# Patient Record
Sex: Female | Born: 2000 | Race: Black or African American | Hispanic: No | Marital: Single | Smoking: Former smoker
Health system: Southern US, Community
[De-identification: ages and names within clinical notes are randomized; demographics above are authoritative.]

## PROBLEM LIST (undated history)

## (undated) ENCOUNTER — Inpatient Hospital Stay (HOSPITAL_COMMUNITY): Payer: Self-pay

## (undated) DIAGNOSIS — S0240EA Zygomatic fracture, right side, initial encounter for closed fracture: Secondary | ICD-10-CM

## (undated) DIAGNOSIS — R519 Headache, unspecified: Secondary | ICD-10-CM

## (undated) DIAGNOSIS — A599 Trichomoniasis, unspecified: Secondary | ICD-10-CM

## (undated) DIAGNOSIS — S36116A Major laceration of liver, initial encounter: Secondary | ICD-10-CM

## (undated) DIAGNOSIS — N390 Urinary tract infection, site not specified: Secondary | ICD-10-CM

## (undated) DIAGNOSIS — N12 Tubulo-interstitial nephritis, not specified as acute or chronic: Secondary | ICD-10-CM

## (undated) DIAGNOSIS — S0292XA Unspecified fracture of facial bones, initial encounter for closed fracture: Secondary | ICD-10-CM

## (undated) DIAGNOSIS — R51 Headache: Secondary | ICD-10-CM

## (undated) HISTORY — PX: NO PAST SURGERIES: SHX2092

---
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
## 2018-01-04 ENCOUNTER — Telehealth: Payer: Self-pay | Admitting: Licensed Clinical Social Worker

## 2018-01-04 NOTE — Telephone Encounter (Signed)
Called pt to regarding scheduled appt. Unable to leave voicemail

## 2018-01-15 ENCOUNTER — Encounter: Payer: Medicaid Other | Admitting: Student

## 2018-01-22 ENCOUNTER — Ambulatory Visit: Payer: Self-pay | Admitting: Clinical

## 2018-01-22 ENCOUNTER — Encounter: Payer: Self-pay | Admitting: General Practice

## 2018-01-22 ENCOUNTER — Other Ambulatory Visit (HOSPITAL_COMMUNITY)
Admission: RE | Admit: 2018-01-22 | Discharge: 2018-01-22 | Disposition: A | Discharge status: Home / Self Care | Payer: Medicaid Other | Source: Ambulatory Visit | Attending: Obstetrics & Gynecology | Admitting: Obstetrics & Gynecology

## 2018-01-22 ENCOUNTER — Ambulatory Visit (INDEPENDENT_AMBULATORY_CARE_PROVIDER_SITE_OTHER): Payer: Medicaid Other | Admitting: Obstetrics & Gynecology

## 2018-01-22 DIAGNOSIS — Z3402 Encounter for supervision of normal first pregnancy, second trimester: Secondary | ICD-10-CM

## 2018-01-22 DIAGNOSIS — Z3400 Encounter for supervision of normal first pregnancy, unspecified trimester: Secondary | ICD-10-CM

## 2018-01-22 DIAGNOSIS — O0990 Supervision of high risk pregnancy, unspecified, unspecified trimester: Secondary | ICD-10-CM | POA: Insufficient documentation

## 2018-01-22 DIAGNOSIS — O99320 Drug use complicating pregnancy, unspecified trimester: Secondary | ICD-10-CM | POA: Insufficient documentation

## 2018-01-22 LAB — POCT URINALYSIS DIP (DEVICE)
BILIRUBIN URINE: NEGATIVE
GLUCOSE, UA: NEGATIVE mg/dL
Hgb urine dipstick: NEGATIVE
KETONES UR: NEGATIVE mg/dL
LEUKOCYTES UA: NEGATIVE
Nitrite: NEGATIVE
Protein, ur: NEGATIVE mg/dL
Specific Gravity, Urine: 1.02 (ref 1.005–1.030)
Urobilinogen, UA: 0.2 mg/dL (ref 0.0–1.0)
pH: 7.5 (ref 5.0–8.0)

## 2018-01-22 MED ORDER — PRENATAL VITAMINS 0.8 MG PO TABS: 1.0000 | ORAL_TABLET | Freq: Every day | ORAL | 12 refills | Status: DC

## 2018-01-22 NOTE — Progress Notes (Signed)
Patient presents to office today for new OB intake. Ob & Medical History collected. New OB labs drawn. Patient will return on 12/20 for new OB appt with Dr Marice Potterove.  Chase Callerarrie H RN BSN 01/22/18

## 2018-01-22 NOTE — BH Specialist Note (Signed)
Integrated Behavioral Health Initial Visit  MRN: [id number] Name: Tiffany SmartNiyah A Leblanc  Number of Integrated Behavioral Health Clinician visits:: 1/6 Session Start time: 10:06  Session End time: 10:16 Total time: 15 minutes  Type of Service: Integrated Behavioral Health- Individual/Family Interpretor:No. Interpretor Name and Language: n/a   Warm Hand Off Completed.       SUBJECTIVE: Tiffany Leblanc is a 17 y.o. female accompanied by n/a Patient was referred by Marylynn Pearsonarrie Hillman, RN for Initial OB introduction to integrated behavioral health services . Patient reports the following symptoms/concerns: Pt states no particular concerns today.  Duration of problem: n/a; Severity of problem: n/a  OBJECTIVE: Mood: Normal and Affect: Appropriate Risk of harm to self or others: No plan to harm self or others  LIFE CONTEXT: Family and Social: - School/Work: - Self-Care: - Life Changes: Current pregnancy  GOALS ADDRESSED: 1. Increase knowledge and/or ability of: healthy habits   INTERVENTIONS: Interventions utilized: Psychoeducation and/or Health Education  Standardized Assessments completed: GAD-7 and PHQ 9  ASSESSMENT: Patient currently experiencing Supervision of low-risk first pregnancy in second trimester   Patient may benefit from Initial OB introduction to integrated behavioral health services.  PLAN: 1. Follow up with behavioral health clinician on : As requested by medical provider or patient 2. Behavioral recommendations:  -Begin taking prenatal vitamins, as recommended by medical provider 3. Referral(s): Integrated [facility]) 4. "From scale of 1-10, how likely are you to follow plan?": 10  Rae LipsJamie C Aliyyah Riese, LCSW  Depression screen [facility] 2/9 01/22/2018  Decreased Interest 2  Down, Depressed, Hopeless 0  PHQ - 2 Score 2  Altered sleeping 2  Tired, decreased energy 3  Change in appetite 2  Feeling bad or failure about yourself  0  Trouble  concentrating 0  Moving slowly or fidgety/restless 0  Suicidal thoughts 0  PHQ-9 Score 9   GAD 7 : Generalized Anxiety Score 01/22/2018  Nervous, Anxious, on Edge 0  Control/stop worrying 1  Worry too much - different things 1  Trouble relaxing 1  Restless 0  Easily annoyed or irritable 0  Afraid - awful might happen 0  Total GAD 7 Score 3

## 2018-01-23 LAB — GC/CHLAMYDIA PROBE AMP (~~LOC~~) NOT AT ARMC
CHLAMYDIA, DNA PROBE: NEGATIVE
Neisseria Gonorrhea: NEGATIVE

## 2018-01-24 LAB — URINE CULTURE, OB REFLEX: Organism ID, Bacteria: NO GROWTH

## 2018-01-24 LAB — CULTURE, OB URINE

## 2018-01-29 ENCOUNTER — Encounter: Payer: Self-pay | Admitting: *Deleted

## 2018-01-30 LAB — SMN1 COPY NUMBER ANALYSIS (SMA CARRIER SCREENING)

## 2018-01-30 LAB — CYSTIC FIBROSIS GENE TEST

## 2018-01-30 LAB — OBSTETRIC PANEL, INCLUDING HIV
Antibody Screen: NEGATIVE
BASOS ABS: 0 10*3/uL (ref 0.0–0.2)
Basos: 1 %
EOS (ABSOLUTE): 0.1 10*3/uL (ref 0.0–0.4)
EOS: 4 %
HEMATOCRIT: 32.1 % — AB (ref 34.0–46.6)
HEMOGLOBIN: 10.7 g/dL — AB (ref 11.1–15.9)
HIV SCREEN 4TH GENERATION: NONREACTIVE
Hepatitis B Surface Ag: NEGATIVE
IMMATURE GRANULOCYTES: 0 %
Immature Grans (Abs): 0 10*3/uL (ref 0.0–0.1)
Lymphocytes Absolute: 1.9 10*3/uL (ref 0.7–3.1)
Lymphs: 49 %
MCH: 28.5 pg (ref 26.6–33.0)
MCHC: 33.3 g/dL (ref 31.5–35.7)
MCV: 85 fL (ref 79–97)
MONOCYTES: 10 %
Monocytes Absolute: 0.4 10*3/uL (ref 0.1–0.9)
NEUTROS ABS: 1.4 10*3/uL (ref 1.4–7.0)
NEUTROS PCT: 36 %
Platelets: 238 10*3/uL (ref 150–450)
RBC: 3.76 x10E6/uL — AB (ref 3.77–5.28)
RDW: 12.5 % (ref 12.3–15.4)
RPR Ser Ql: NONREACTIVE
RUBELLA: 3.28 {index} (ref >0.99)
Rh Factor: POSITIVE
WBC: 3.8 10*3/uL (ref 3.4–10.8)

## 2018-01-30 LAB — HEMOGLOBINOPATHY EVALUATION
Ferritin: 39 ng/mL (ref 15–77)
HGB A2 QUANT: 2.2 % (ref 1.8–3.2)
HGB SOLUBILITY: NEGATIVE
HGB VARIANT: 0 %
Hgb A: 97.8 % (ref 96.4–98.8)
Hgb C: 0 %
Hgb F Quant: 0 % (ref 0.0–2.0)
Hgb S: 0 %

## 2018-01-30 NOTE — Progress Notes (Signed)
I have reviewed this chart and agree with the RN/CMA assessment and management.    Jamir Rone C Sanjit Mcmichael, MD, FACOG Attending Physician, Faculty Practice [facility]  

## 2018-02-08 ENCOUNTER — Ambulatory Visit (INDEPENDENT_AMBULATORY_CARE_PROVIDER_SITE_OTHER): Payer: Medicaid Other | Admitting: Obstetrics & Gynecology

## 2018-02-08 VITALS — BP 124/78 | HR 102 | Wt 130.1 lb

## 2018-02-08 DIAGNOSIS — Z8279 Family history of other congenital malformations, deformations and chromosomal abnormalities: Secondary | ICD-10-CM

## 2018-02-08 DIAGNOSIS — Z3402 Encounter for supervision of normal first pregnancy, second trimester: Secondary | ICD-10-CM

## 2018-02-08 DIAGNOSIS — Z3480 Encounter for supervision of other normal pregnancy, unspecified trimester: Secondary | ICD-10-CM

## 2018-02-08 NOTE — Progress Notes (Signed)
Ultrasound scheduled for 02/25/18 @ 1415

## 2018-02-08 NOTE — Patient Instructions (Signed)
AREA PEDIATRIC/FAMILY PRACTICE PHYSICIANS  [facility] [address], [address] [place], [place]  [address] Phone - [phone]   Fax - [phone]  [facility] [address] [place], [place] [address] Phone - [phone]   Fax - [phone]  [facility] [address] [place], [place]  [address] Phone - [phone]   Fax - [phone]  [facility] [address] [place], [place]  [address] Phone - [phone]   Fax - [phone]  [facility] [address] [place], [place]  [address] Phone - [phone]   Fax - [phone]  [facility] [address] [place], [place]  [address] Phone - [phone]   Fax - [phone]  [facility] [address] [place], [place]  [address] Phone - [phone]   Fax - [phone]  [facility] [address] [place], [place]  [address] Phone - [phone]   Fax - [phone]  [facility] [address] [place], [place]  [address] Phone - [phone]   Fax - [phone] [facility] [address] [place], [place]  [address] Phone - [phone]   Fax - [phone]  [facility] [address] [place], [place]  [address] Phone - [phone]   Fax - [phone]  [facility] [address] [place], [place]  [address] Phone - [phone]   Fax - [phone]  [facility] [address] [place], [place]  [address] Phone - [phone]   Fax - [phone]  [facility] [address] [place], [place]  [address] Phone - [phone]  [facility] [address] [place], [place]  [address] Phone - [phone]   Fax - [phone]  [facility] [address] [place], [place]  [address] Phone - [phone]   Fax - [phone]  [facility] [address] [place], [place]  [address] Phone - [phone]   Fax - [phone]  [facility] [address] [place], [place]  [address] Phone - [phone]   Fax - [phone]   [facility] [address] [place], [place]  [address] Phone - [phone]   Fax - [phone]  [facility] [address] [place], [place]  [address] Phone - [phone]   Fax - [phone]  [facility] [address] [place], [place]  [address] Phone - [phone]   Fax - [phone]  [facility] [address] [place], [place]  [address] Phone - [phone]   Fax - [phone]  [facility] [address] [place], [place]  [address] Phone - [phone]   Fax - [phone] [facility] [address] [place], [place]  [address] Phone - [phone]   Fax - [phone]  [facility] [address] [place], [place]  [address] Phone - [phone]   Fax - [phone]   [facility] [address] [place], [place]  [address] Phone - [phone]   Fax - [phone]  [facility] Charlene Flemming MD [address] [place] [address] Phone [phone]  Fax [phone]  Childbirth Education Options: [place] [facility] Classes:  Childbirth education classes can help you   get ready for a positive parenting experience. You can also meet other expectant parents and get free stuff for your baby. Each class runs for five weeks on the same night and costs $45 for the mother-to-be and her support person. Medicaid covers the cost if you are eligible. Call [phone] to register. [facility] Childbirth Education:  [phone] or [phone] or  [email or web address]  Baby & Me Class: Discuss newborn & infant parenting and family adjustment issues with other new mothers in a relaxed environment. Each week brings a new speaker or baby-centered activity. We encourage new mothers to join us every Thursday at 11:00am. Babies birth until crawling. No registration or fee. Daddy Boot Camp: This course offers Dads-to-be the tools and knowledge needed to feel confident on their journey to becoming new fathers. Experienced dads, who have been trained as coaches, teach dads-to-be how to hold, comfort, diaper, swaddle and play with their infant while being able to support the new mom as well. A class for men taught by men. $25/dad Big Brother/Big Sister: Let your children share in the joy of a new brother or sister in this special class designed just for them. Class includes discussion about how families care for babies: swaddling, holding, diapering, safety as well as how they can be helpful in their new role. This class is designed for children ages 2 to 6, but any age is welcome. Please register each child individually. $5/child  Mom Talk: This mom-led group offers support and connection to mothers as they journey through the adjustments and struggles of that sometimes overwhelming first year after the birth of a child. Tuesdays at 10:00am and Thursdays at 6:00pm. Babies welcome. No registration or fee. Breastfeeding Support Group: This group is a mother-to-mother support circle where moms have the opportunity to share their breastfeeding experiences. A Lactation Consultant is present for questions and concerns. Meets each Tuesday at 11:00am. No fee or registration. Breastfeeding Your Baby: Learn what to expect in the first days of breastfeeding your newborn.  This class will help you feel more confident with the skills needed to begin your breastfeeding experience. Many new mothers are concerned about breastfeeding after leaving the hospital. This class  will also address the most common fears and challenges about breastfeeding during the first few weeks, months and beyond. (call for fee) Comfort Techniques and Tour: This 2 hour interactive class will provide you the opportunity to learn & practice hands-on techniques that can help relieve some of the discomfort of labor and encourage your baby to rotate toward the best position for birth. You and your partner will be able to try a variety of labor positions with birth balls and rebozos as well as practice breathing, relaxation, and visualization techniques. A tour of the [facility] is included with this class. $20 per registrant and support person Childbirth Class- Weekend Option: This class is a Weekend version of our Birth & Baby series. It is designed for parents who have a difficult time fitting several weeks of classes into their schedule. It covers the care of your newborn and the basics of labor and childbirth. It also includes a Maternity Care Center Tour of [facility] and lunch. The class is held two consecutive days: beginning on Friday evening from 6:30 - 8:30 p.m. and the next day, Saturday from 9 a.m. - 4 p.m. (call for fee) Waterbirth Class: Interested in a waterbirth?  This informational class will help you discover whether waterbirth is the right fit for you.   Education about waterbirth itself, supplies you would need and how to assemble your support team is what you can expect from this class. Some obstetrical practices require this class in order to pursue a waterbirth. (Not all obstetrical practices offer waterbirth-check with your healthcare provider.) Register only the expectant mom, but you are encouraged to bring your partner to class! Required if planning waterbirth, no fee. Infant/Child CPR: Parents, grandparents, babysitters, and friends learn Cardio-Pulmonary Resuscitation skills for infants and children. You will also learn how to treat both conscious  and unconscious choking in infants and children. This Family & Friends program does not offer certification. Register each participant individually to ensure that enough mannequins are available. (Call for fee) Grandparent Love: Expecting a grandbaby? This class is for you! Learn about the latest infant care and safety recommendations and ways to support your own child as he or she transitions into the parenting role. Taught by Registered Nurses who are childbirth instructors, but most importantly...they are grandmothers too! $10/person. Childbirth Class- Natural Childbirth: This series of 5 weekly classes is for expectant parents who want to learn and practice natural methods of coping with the process of labor and childbirth. Relaxation, breathing, massage, visualization, role of the partner, and helpful positioning are highlighted. Participants learn how to be confident in their body's ability to give birth. This class will empower and help parents make informed decisions about their own care. Includes discussion that will help new parents transition into the immediate postpartum period. [facility] is included. We suggest taking this class between 25-32 weeks, but it's only a recommendation. $75 per registrant and one support person or $30 Medicaid. Childbirth Class- 3 week Series: This option of 3 weekly classes helps you and your labor partner prepare for childbirth. Newborn care, labor & birth, cesarean birth, pain management, and comfort techniques are discussed and a [place] [facility] is included. The class meets at the same time, on the same day of the week for 3 consecutive weeks beginning with the starting date you choose. $60 for registrant and one support person.  Marvelous Multiples: Expecting twins, triplets, or more? This class covers the differences in labor, birth, parenting, and breastfeeding issues that face multiples' parents.  NICU tour is included. Led by a Certified Childbirth Educator who is the mother of twins. No fee. Caring for Baby: This class is for expectant and adoptive parents who want to learn and practice the most up-to-date newborn care for their babies. Focus is on birth through the first six weeks of life. Topics include feeding, bathing, diapering, crying, umbilical cord care, circumcision care and safe sleep. Parents learn to recognize symptoms of illness and when to call the pediatrician. Register only the mom-to-be and your partner or support person can plan to come with you! $10 per registrant and support person Childbirth Class- online option: This online class offers you the freedom to complete a Birth and Baby series in the comfort of your own home. The flexibility of this option allows you to review sections at your own pace, at times convenient to you and your support people. It includes additional video information, animations, quizzes, and extended activities. Get organized with helpful eClass tools, checklists, and trackers. Once you register online for the class, you will receive an email within a few days to accept the invitation and begin the class when the time is right for you. The content will be available to you for 60 days. $  60 for 60 days of online access for you and your support people.  Local Doulas: Natural Baby Doulas [email or web address] Tel: [phone] [email or web address] [facility] [phone] [email or web address] The Labor Ladies  (also do waterbirth tub rental) [phone] [email or web address] [facility] [phone] [email or web address] Sacred Rhythms  [phone] [email or web address] [place] [facility] [employer] (PADA) [email or web address] http://www.padanc.org/index.htm [place] [facility]   [email or web address] Considering Waterbirth? Guide for patients at [facility]  Why consider waterbirth?  . Gentle birth for babies . Less pain medicine used in labor . May allow for passive descent/less pushing . May reduce perineal tears  . More mobility and instinctive maternal position changes . Increased maternal relaxation . Reduced blood pressure in labor  Is waterbirth safe? What are the risks of infection, drowning or other complications?  . Infection: o Very low risk (3.7 % for tub vs 4.8% for bed) o 7 in 8000 waterbirths with documented infection o Poorly cleaned equipment most common cause o Slightly lower group B strep transmission rate  . Drowning o Maternal:  - Very low risk   - Related to seizures or fainting o Newborn:  - Very low risk. No evidence of increased risk of respiratory problems in multiple large studies - Physiological protection from breathing under water - Avoid underwater birth if there are any fetal complications - Once baby's head is out of the water, keep it out.  . Birth complication o Some reports of cord trauma, but risk decreased by bringing baby to surface gradually o No evidence of increased risk of shoulder dystocia. Mothers can usually change positions faster in water than in a bed, possibly aiding the maneuvers to free the shoulder.   You must attend a Waterbirth class at [facility]  3rd Wednesday of every month from 7-9pm  Free  Register by calling [phone] or online at [email or web address]  Bring us the certificate from the class to your prenatal appointment  Meet with a midwife at 36 weeks to see if you can still plan a waterbirth and to sign the consent.   Purchase or rent the following supplies:   Water Birth Pool (Birth Pool in a Box or [place] for instance)  (Tubs start ~$125)  Single-use disposable tub liner designed for your brand of tub  New garden hose labeled  "lead-free", "suitable for drinking water",  Electric drain pump to remove water (We recommend 792 gallon per hour or greater pump.)   Separate garden hose to remove the dirty water  Fish net  Bathing suit top (optional)  Long-handled mirror (optional)  Places to purchase or rent supplies  Yourwaterbirth.com for tub purchases and supplies  Waterbirthsolutions.com for tub purchases and supplies  The Labor Ladies (www.[email or web address]) $275 for tub rental/set-up & take down/kit   [place] [facility] [employer] (http://www.padanc.org/MeetUs.htm) Information regarding doulas (labor support) who provide pool rentals  Our practice has a Birth Pool in a Box tub at the hospital that you may borrow on a first-come-first-served basis. It is your responsibility to to set up, clean and break down the tub. We cannot guarantee the availability of this tub in advance. You are responsible for bringing all accessories listed above. If you do not have all necessary supplies you cannot have a waterbirth.    Things that would prevent you from having a waterbirth:  Premature, <37wks  Previous cesarean birth  Presence of thick meconium-stained fluid  Multiple gestation (Twins, triplets, etc.)  Uncontrolled diabetes or gestational diabetes requiring medication  Hypertension requiring medication   or diagnosis of pre-eclampsia  Heavy vaginal bleeding  Non-reassuring fetal heart rate  Active infection (MRSA, etc.). Group B Strep is NOT a contraindication for  waterbirth.  If your labor has to be induced and induction method requires continuous  monitoring of the baby's heart rate  Other risks/issues identified by your obstetrical provider  Please remember that birth is unpredictable. Under certain unforeseeable circumstances your provider may advise against giving birth in the tub. These decisions will be made on a case-by-case basis and with the safety of you and your baby as our highest  priority.      
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
## 2018-02-12 LAB — AFP, SERUM, OPEN SPINA BIFIDA
AFP MoM: 0.58
AFP VALUE AFPOSL: 26.8 ng/mL
Gest. Age on Collection Date: 17 weeks
MATERNAL AGE AT EDD: 19.5 a
OSBR Risk 1 IN: 10000
Test Results:: NEGATIVE
Weight: 130 [lb_av]

## 2018-02-18 ENCOUNTER — Encounter (HOSPITAL_COMMUNITY): Payer: Self-pay

## 2018-02-20 NOTE — L&D Delivery Note (Signed)
Operative Delivery Note Pt pushed vtx to +3 and variable decels became prolonged. Dr. Macon Large offered and pt accepted a VAD. Verbal consent: obtained from patient.  Risks and benefits discussed in detail.  Risks include, but are not limited to the risks of anesthesia, bleeding, infection, damage to maternal tissues, fetal cephalhematoma.  There is also the risk of inability to effect vaginal delivery of the head, or shoulder dystocia that cannot be resolved by established maneuvers, leading to the need for emergency cesarean section. At 4:22 AM a viable female was delivered via Vaginal, Vacuum Investment banker, operational).  Presentation: vertex; Position: Occiput,, Anterior; Station: +3.  UPon delivery, the baby was floppy, so the cord was clamped and cut.and baby given to awaiting NICU team.     APGAR: 6, 8; weight  Pending. .    The placenta separated spontaneously and delivered via CCT and maternal pushing effort.  It was inspected and appears to be intact with a 3 VC.   40 units of pitocin diluted in 1000cc LR was infused rapidly IV.   , .   Cord:  with the following complications: .  Cord pH: pending  Anesthesia:  epidural Episiotomy:  none Lacerations: Periurethral Suture Repair: 4-0 vicryl Est. Blood Loss (mL): 200  Mom to postpartum.  Baby to Couplet care / Skin to Skin.  Scarlette Calico Cresenzo-Dishmon 07/14/2018, 4:42 AM

## 2018-02-25 ENCOUNTER — Ambulatory Visit (HOSPITAL_COMMUNITY)
Admission: RE | Admit: 2018-02-25 | Discharge: 2018-02-25 | Disposition: A | Discharge status: Home / Self Care | Payer: Medicaid Other | Source: Ambulatory Visit | Attending: Obstetrics & Gynecology | Admitting: Obstetrics & Gynecology

## 2018-02-25 DIAGNOSIS — Z363 Encounter for antenatal screening for malformations: Secondary | ICD-10-CM

## 2018-02-25 DIAGNOSIS — Z3480 Encounter for supervision of other normal pregnancy, unspecified trimester: Secondary | ICD-10-CM | POA: Diagnosis not present

## 2018-02-25 DIAGNOSIS — Z3A19 19 weeks gestation of pregnancy: Secondary | ICD-10-CM | POA: Diagnosis not present

## 2018-02-26 ENCOUNTER — Encounter: Payer: Self-pay | Admitting: Obstetrics & Gynecology

## 2018-02-26 ENCOUNTER — Other Ambulatory Visit (HOSPITAL_COMMUNITY): Payer: Self-pay | Admitting: *Deleted

## 2018-02-26 DIAGNOSIS — O43199 Other malformation of placenta, unspecified trimester: Secondary | ICD-10-CM

## 2018-02-26 DIAGNOSIS — Z049 Encounter for examination and observation for unspecified reason: Secondary | ICD-10-CM | POA: Insufficient documentation

## 2018-02-27 ENCOUNTER — Telehealth: Payer: Self-pay | Admitting: Licensed Clinical Social Worker

## 2018-02-27 NOTE — Telephone Encounter (Signed)
Pt confirm appt. 

## 2018-02-28 ENCOUNTER — Encounter: Payer: Medicaid Other | Admitting: Student

## 2018-02-28 NOTE — Patient Instructions (Signed)
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
## 2018-03-01 ENCOUNTER — Encounter: Payer: Self-pay | Admitting: Advanced Practice Midwife

## 2018-03-01 ENCOUNTER — Ambulatory Visit (INDEPENDENT_AMBULATORY_CARE_PROVIDER_SITE_OTHER): Payer: Medicaid Other | Admitting: Advanced Practice Midwife

## 2018-03-01 ENCOUNTER — Encounter: Payer: Self-pay | Admitting: Family Medicine

## 2018-03-01 VITALS — BP 103/63 | HR 60 | Wt 132.8 lb

## 2018-03-01 DIAGNOSIS — Z594 Lack of adequate food and safe drinking water: Secondary | ICD-10-CM

## 2018-03-01 DIAGNOSIS — O43192 Other malformation of placenta, second trimester: Secondary | ICD-10-CM

## 2018-03-01 DIAGNOSIS — Z3402 Encounter for supervision of normal first pregnancy, second trimester: Secondary | ICD-10-CM

## 2018-03-01 DIAGNOSIS — Z5941 Food insecurity: Secondary | ICD-10-CM

## 2018-03-01 DIAGNOSIS — Z3A20 20 weeks gestation of pregnancy: Secondary | ICD-10-CM

## 2018-03-01 DIAGNOSIS — F329 Major depressive disorder, single episode, unspecified: Secondary | ICD-10-CM | POA: Insufficient documentation

## 2018-03-01 DIAGNOSIS — F32A Depression, unspecified: Secondary | ICD-10-CM | POA: Insufficient documentation

## 2018-03-01 DIAGNOSIS — O99342 Other mental disorders complicating pregnancy, second trimester: Secondary | ICD-10-CM

## 2018-03-01 DIAGNOSIS — Z8279 Family history of other congenital malformations, deformations and chromosomal abnormalities: Secondary | ICD-10-CM

## 2018-03-01 DIAGNOSIS — O43199 Other malformation of placenta, unspecified trimester: Secondary | ICD-10-CM

## 2018-03-01 HISTORY — DX: Food insecurity: Z59.41

## 2018-03-01 MED ORDER — PRENATAL VITAMINS 0.8 MG PO TABS: 1.0000 | ORAL_TABLET | Freq: Every day | ORAL | 12 refills | Status: DC

## 2018-03-01 NOTE — Progress Notes (Signed)
Fetal echo scheduled with Dr Elizebeth Brooking for 04/16/18 @ 0930. Pt made aware and verbalized understanding.

## 2018-03-07 ENCOUNTER — Encounter (HOSPITAL_COMMUNITY): Payer: Self-pay | Admitting: *Deleted

## 2018-03-07 ENCOUNTER — Inpatient Hospital Stay (HOSPITAL_COMMUNITY)
Admission: AD | Admit: 2018-03-07 | Discharge: 2018-03-07 | Disposition: A | Discharge status: Home / Self Care | Payer: Medicaid Other | Attending: Obstetrics & Gynecology | Admitting: Obstetrics & Gynecology

## 2018-03-07 DIAGNOSIS — O26892 Other specified pregnancy related conditions, second trimester: Secondary | ICD-10-CM | POA: Insufficient documentation

## 2018-03-07 DIAGNOSIS — T148XXA Other injury of unspecified body region, initial encounter: Secondary | ICD-10-CM | POA: Diagnosis not present

## 2018-03-07 DIAGNOSIS — S39011A Strain of muscle, fascia and tendon of abdomen, initial encounter: Secondary | ICD-10-CM | POA: Insufficient documentation

## 2018-03-07 DIAGNOSIS — Z3492 Encounter for supervision of normal pregnancy, unspecified, second trimester: Secondary | ICD-10-CM

## 2018-03-07 DIAGNOSIS — R109 Unspecified abdominal pain: Secondary | ICD-10-CM | POA: Diagnosis present

## 2018-03-07 DIAGNOSIS — O43192 Other malformation of placenta, second trimester: Secondary | ICD-10-CM

## 2018-03-07 DIAGNOSIS — O43199 Other malformation of placenta, unspecified trimester: Secondary | ICD-10-CM

## 2018-03-07 DIAGNOSIS — Z3A20 20 weeks gestation of pregnancy: Secondary | ICD-10-CM | POA: Diagnosis not present

## 2018-03-07 LAB — URINALYSIS, ROUTINE W REFLEX MICROSCOPIC
BILIRUBIN URINE: NEGATIVE
Glucose, UA: NEGATIVE mg/dL
Hgb urine dipstick: NEGATIVE
KETONES UR: NEGATIVE mg/dL
Leukocytes, UA: NEGATIVE
NITRITE: NEGATIVE
PH: 7 (ref 5.0–8.0)
Protein, ur: NEGATIVE mg/dL
Specific Gravity, Urine: 1.018 (ref 1.005–1.030)

## 2018-03-07 MED ORDER — ACETAMINOPHEN 325 MG PO TABS: 650.0000 mg | ORAL_TABLET | ORAL | 2 refills | Status: DC | PRN

## 2018-03-07 MED ORDER — ACETAMINOPHEN 500 MG PO TABS
1000.0000 mg | ORAL_TABLET | Freq: Once | ORAL | Status: AC
  Administered 2018-03-07: 1000 mg via ORAL
  Filled 2018-03-07: qty 2

## 2018-03-07 NOTE — MAU Note (Signed)
Pt reports sharp pain in her lower abd sicne 7 am, denies bleeding.

## 2018-03-07 NOTE — MAU Provider Note (Signed)
History     CSN: [id number]  Arrival date and time: 03/07/18 [id number]   First Provider Initiated Contact with Patient 03/07/18 0957      Chief Complaint  Patient presents with  . Abdominal Pain   HPI Tiffany Leblanc is a 18 y.o. G1P0000 at 6359w6d who presents to MAU with chief complaint of abdominal pain. This is a new problem, onset this morning at about 0700. Pain is 7/10, periumbilical, does not radiate. She denies aggravating or alleviating factors. She has not taken medication or tried other treatments for this pain. She denies bleeding, lower abdominal pain, flank pain, urinary symptoms, fever or recent illness.  Patient states she was wrestling with her uncle last night at  About 11pm. She endorses hitting her abdomen during their horseplay.   OB History    Gravida  1   Para  0   Term  0   Preterm  0   AB  0   Living  0     SAB  0   TAB  0   Ectopic  0   Multiple  0   Live Births  0        Obstetric Comments  Patient admits to being sexually active when asked in private        Past Medical History:  Diagnosis Date  . Headache   . Pyelonephritis   . Urinary tract infection     Past Surgical History:  Procedure Laterality Date  . NO PAST SURGERIES      Family History  Problem Relation Age of Onset  . Heart murmur Brother   . Hypertension Maternal Grandmother   . Arthritis Maternal Grandfather   . Cancer Paternal Grandmother     Social History   Tobacco Use  . Smoking status: Never Smoker  . Smokeless tobacco: Never Used  Substance Use Topics  . Alcohol use: Not Currently  . Drug use: Yes    Types: Marijuana    Allergies:  Allergies  Allergen Reactions  . Coconut Oil     States hives with coconut soap    Medications Prior to Admission  Medication Sig Dispense Refill Last Dose  . Prenatal Multivit-Min-Fe-FA (PRENATAL VITAMINS) 0.8 MG tablet Take 1 tablet by mouth daily. 30 tablet 12     Review of Systems  Constitutional:  Negative for chills, fatigue and fever.  Gastrointestinal: Positive for abdominal pain. Negative for constipation, diarrhea, nausea and vomiting.  Genitourinary: Negative for difficulty urinating, dysuria, flank pain, pelvic pain, vaginal bleeding, vaginal discharge and vaginal pain.  Musculoskeletal: Negative for back pain.  All other systems reviewed and are negative.  Physical Exam   Blood pressure 114/65, pulse 78, temperature 97.7 F (36.5 C), temperature source Oral, resp. rate 16, last menstrual period 10/12/2017, SpO2 100 %.  Physical Exam  Nursing note and vitals reviewed. Constitutional: She is oriented to person, place, and time. She appears well-developed and well-nourished.  Cardiovascular: Normal rate.  Respiratory: Effort normal.  GI: Soft. Bowel sounds are normal. She exhibits no distension. There is no abdominal tenderness. There is no rebound, no guarding and no CVA tenderness.  Gravid  Neurological: She is alert and oriented to person, place, and time.  Skin: Skin is warm and dry.  Psychiatric: She has a normal mood and affect. Her behavior is normal. Judgment normal.    MAU Course/MDM   --Pertinent negatives: vaginal bleeding, pain with urination, fever, flank pain, history of UTIs or kidney stones --Pain relieved with  Tylenol given in MAU --No concerning findings on physical exam or UA  Patient Vitals for the past 24 hrs:  BP Temp Temp src Pulse Resp SpO2  03/07/18 1135 117/62 - - 87 - -  03/07/18 0934 114/65 97.7 F (36.5 C) Oral 78 16 100 %    Results for orders placed or performed during the hospital encounter of 03/07/18 (from the past 24 hour(s))  Urinalysis, Routine w reflex microscopic     Status: Abnormal   Collection Time: 03/07/18 11:17 AM  Result Value Ref Range   Color, Urine YELLOW YELLOW   APPearance HAZY (A) CLEAR   Specific Gravity, Urine 1.018 1.005 - 1.030   pH 7.0 5.0 - 8.0   Glucose, UA NEGATIVE NEGATIVE mg/dL   Hgb urine dipstick  NEGATIVE NEGATIVE   Bilirubin Urine NEGATIVE NEGATIVE   Ketones, ur NEGATIVE NEGATIVE mg/dL   Protein, ur NEGATIVE NEGATIVE mg/dL   Nitrite NEGATIVE NEGATIVE   Leukocytes, UA NEGATIVE NEGATIVE    Meds ordered this encounter  Medications  . acetaminophen (TYLENOL) tablet 1,000 mg  . acetaminophen (TYLENOL) 325 MG tablet    Sig: Take 2 tablets (650 mg total) by mouth every 4 (four) hours as needed.    Dispense:  100 tablet    Refill:  2    Order Specific Question:   Supervising Provider    Answer:   Reva BoresPRATT, TANYA S [[id number]]   Assessment and Plan  --18 y.o. G1P0000 at 3172w6d  --FHT 143 by Doppler --Muscle strain related to recent physical exertion. Rx Tylenol --Discharge home in stable condition.  [facility] 03/29/2018  Tiffany CantorSamantha C Bing Leblanc, CNM 03/07/2018, 11:48 AM

## 2018-03-07 NOTE — Discharge Instructions (Signed)

## 2018-03-29 ENCOUNTER — Ambulatory Visit (INDEPENDENT_AMBULATORY_CARE_PROVIDER_SITE_OTHER): Payer: Medicaid Other | Admitting: Advanced Practice Midwife

## 2018-03-29 VITALS — BP 107/57 | HR 68 | Wt 138.9 lb

## 2018-03-29 DIAGNOSIS — Z3402 Encounter for supervision of normal first pregnancy, second trimester: Secondary | ICD-10-CM

## 2018-03-29 NOTE — Patient Instructions (Signed)
Glucose Tolerance Test During Pregnancy Why am I having this test? The glucose tolerance test (GTT) is done to check how your body processes sugar (glucose). This is one of several tests used to diagnose diabetes that develops during pregnancy (gestational diabetes mellitus). Gestational diabetes is a temporary form of diabetes that some women develop during pregnancy. It usually occurs during the second trimester of pregnancy and goes away after delivery. Testing (screening) for gestational diabetes usually occurs between 24 and 28 weeks of pregnancy. You may have the GTT test after having a 1-hour glucose screening test if the results from that test indicate that you may have gestational diabetes. You may also have this test if:  You have a history of gestational diabetes.  You have a history of giving birth to very large babies or have experienced repeated fetal loss (stillbirth).  You have signs and symptoms of diabetes, such as: ? Changes in your vision. ? Tingling or numbness in your hands or feet. ? Changes in hunger, thirst, and urination that are not otherwise explained by your pregnancy. What is being tested? This test measures the amount of glucose in your blood at different times during a period of 3 hours. This indicates how well your body is able to process glucose. What kind of sample is taken?  Blood samples are required for this test. They are usually collected by inserting a needle into a blood vessel. How do I prepare for this test?  For 3 days before your test, eat normally. Have plenty of carbohydrate-rich foods.  Follow instructions from your health care provider about: ? Eating or drinking restrictions on the day of the test. You may be asked to not eat or drink anything other than water (fast) starting 8-10 hours before the test. ? Changing or stopping your regular medicines. Some medicines may interfere with this test. Tell a health care provider about:  All  medicines you are taking, including vitamins, herbs, eye drops, creams, and over-the-counter medicines.  Any blood disorders you have.  Any surgeries you have had.  Any medical conditions you have. What happens during the test? First, your blood glucose will be measured. This is referred to as your fasting blood glucose, since you fasted before the test. Then, you will drink a glucose solution that contains a certain amount of glucose. Your blood glucose will be measured again 1, 2, and 3 hours after drinking the solution. This test takes about 3 hours to complete. You will need to stay at the testing location during this time. During the testing period:  Do not eat or drink anything other than the glucose solution.  Do not exercise.  Do not use any products that contain nicotine or tobacco, such as cigarettes and e-cigarettes. If you need help stopping, ask your health care provider. The testing procedure may vary among health care providers and hospitals. How are the results reported? Your results will be reported as milligrams of glucose per deciliter of blood (mg/dL) or millimoles per liter (mmol/L). Your health care provider will compare your results to normal ranges that were established after testing a large group of people (reference ranges). Reference ranges may vary among labs and hospitals. For this test, common reference ranges are:  Fasting: less than 95-105 mg/dL (5.3-5.8 mmol/L).  1 hour after drinking glucose: less than 180-190 mg/dL (10.0-10.5 mmol/L).  2 hours after drinking glucose: less than 155-165 mg/dL (8.6-9.2 mmol/L).  3 hours after drinking glucose: 140-145 mg/dL (7.8-8.1 mmol/L). What do the   results mean? Results within reference ranges are considered normal, meaning that your glucose levels are well-controlled. If two or more of your blood glucose levels are high, you may be diagnosed with gestational diabetes. If only one level is high, your health care  provider may suggest repeat testing or other tests to confirm a diagnosis. Talk with your health care provider about what your results mean. Questions to ask your health care provider Ask your health care provider, or the department that is doing the test:  When will my results be ready?  How will I get my results?  What are my treatment options?  What other tests do I need?  What are my next steps? Summary  The glucose tolerance test (GTT) is one of several tests used to diagnose diabetes that develops during pregnancy (gestational diabetes mellitus). Gestational diabetes is a temporary form of diabetes that some women develop during pregnancy.  You may have the GTT test after having a 1-hour glucose screening test if the results from that test indicate that you may have gestational diabetes. You may also have this test if you have any symptoms or risk factors for gestational diabetes.  Talk with your health care provider about what your results mean. This information is not intended to replace advice given to you by your health care provider. Make sure you discuss any questions you have with your health care provider. Document Released: 08/08/2011 Document Revised: 09/18/2016 Document Reviewed: 09/18/2016 Elsevier Interactive Patient Education  2019 Elsevier Inc.  

## 2018-03-29 NOTE — Progress Notes (Signed)
Subjective: Tiffany Leblanc is a G1P0000 at [redacted]w[redacted]d who presents to the [facility] today for ob visit.  She does not have any current mental health concerns. She is not currently sexually active. She is currently using no method for birth control. Patient states mother and father of baby as her support system.   LMP 10/12/2017   Birth Control History:  No prior history  MDM Patient counseled on all options for birth control today including LARC. Patient desires no method  initiated for birth control.   Assessment:  18 y.o. female considering no method for birth control  Plan: Patient reserved 3 tickets to tour the new hospital and wic appt is scheduled for 04/03/2018 @ 215pm  Gwyndolyn Saxon, [place] 03/29/2018 11:26 AM
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
## 2018-04-01 ENCOUNTER — Encounter (HOSPITAL_COMMUNITY): Payer: Self-pay

## 2018-04-01 ENCOUNTER — Ambulatory Visit (HOSPITAL_COMMUNITY): Payer: Medicaid Other | Attending: Advanced Practice Midwife

## 2018-04-08 ENCOUNTER — Ambulatory Visit (HOSPITAL_COMMUNITY)
Admission: RE | Admit: 2018-04-08 | Discharge: 2018-04-08 | Disposition: A | Discharge status: Home / Self Care | Payer: Medicaid Other | Source: Ambulatory Visit | Attending: Maternal & Fetal Medicine | Admitting: Maternal & Fetal Medicine

## 2018-04-08 DIAGNOSIS — Z3A25 25 weeks gestation of pregnancy: Secondary | ICD-10-CM | POA: Diagnosis not present

## 2018-04-08 DIAGNOSIS — O43199 Other malformation of placenta, unspecified trimester: Secondary | ICD-10-CM | POA: Diagnosis not present

## 2018-04-08 DIAGNOSIS — Z362 Encounter for other antenatal screening follow-up: Secondary | ICD-10-CM | POA: Diagnosis not present

## 2018-04-25 ENCOUNTER — Other Ambulatory Visit: Payer: Self-pay | Admitting: *Deleted

## 2018-04-25 DIAGNOSIS — Z3480 Encounter for supervision of other normal pregnancy, unspecified trimester: Secondary | ICD-10-CM

## 2018-04-26 ENCOUNTER — Other Ambulatory Visit: Payer: Self-pay

## 2018-04-26 ENCOUNTER — Encounter: Payer: Self-pay | Admitting: Advanced Practice Midwife

## 2018-05-01 ENCOUNTER — Other Ambulatory Visit: Payer: Self-pay

## 2018-05-02 ENCOUNTER — Encounter: Payer: Self-pay | Admitting: Obstetrics and Gynecology

## 2018-05-02 ENCOUNTER — Other Ambulatory Visit: Payer: Self-pay

## 2018-05-03 ENCOUNTER — Other Ambulatory Visit: Payer: Self-pay

## 2018-05-03 ENCOUNTER — Ambulatory Visit (INDEPENDENT_AMBULATORY_CARE_PROVIDER_SITE_OTHER): Payer: Medicaid Other | Admitting: Advanced Practice Midwife

## 2018-05-03 ENCOUNTER — Other Ambulatory Visit: Payer: Medicaid Other

## 2018-05-03 VITALS — BP 120/78 | HR 79 | Wt 148.1 lb

## 2018-05-03 DIAGNOSIS — Z3A29 29 weeks gestation of pregnancy: Secondary | ICD-10-CM

## 2018-05-03 DIAGNOSIS — Z23 Encounter for immunization: Secondary | ICD-10-CM | POA: Diagnosis not present

## 2018-05-03 DIAGNOSIS — Z3A30 30 weeks gestation of pregnancy: Secondary | ICD-10-CM

## 2018-05-03 DIAGNOSIS — Z8279 Family history of other congenital malformations, deformations and chromosomal abnormalities: Secondary | ICD-10-CM

## 2018-05-03 DIAGNOSIS — O43193 Other malformation of placenta, third trimester: Secondary | ICD-10-CM

## 2018-05-03 DIAGNOSIS — Z3480 Encounter for supervision of other normal pregnancy, unspecified trimester: Secondary | ICD-10-CM

## 2018-05-03 DIAGNOSIS — Z3403 Encounter for supervision of normal first pregnancy, third trimester: Secondary | ICD-10-CM

## 2018-05-03 DIAGNOSIS — O43199 Other malformation of placenta, unspecified trimester: Secondary | ICD-10-CM

## 2018-05-03 NOTE — Patient Instructions (Addendum)
AREA PEDIATRIC/FAMILY PRACTICE PHYSICIANS  [place] ([address]) . [facility] o Chambliss, MD; Eniola, MD; Hale, MD; Hensel, MD; McDiarmid, MD; McIntyer, MD; Neal, MD; Walden, MD o [address] [place], [place] [address] o [phone] o Mon-Fri 8:30-12:30, 1:30-5:00 o Providers come to see babies at [facility] o Accepting Medicaid . [facility] o Limited providers who accept newborns: Koirala, MD; Morrow, MD; Wolters, MD o [address], [place], [place] [address] o [phone] o Mon-Fri 8:00-5:30 o Babies seen by providers at [facility] o Does NOT accept Medicaid o Please call early in hospitalization for appointment (limited availability)  . [facility] o [place], MD o [address] [place], [place] [address] o [phone] o Mon, Tue, Thur, Fri 8:30-5:00, Wed 10:00-7:00 (closed 1-2pm) o Babies seen by [facility] providers o Accepting Medicaid . Rubin - Pediatrician o Rubin, MD o [address], [place], [place] [address] o [phone] o Mon-Fri 8:30-5:00, Sat 8:30-12:00 o Provider comes to see babies at [facility] o Accepting Medicaid o Must have been referred from current patients or contacted office prior to delivery . [facility] ([facility] o Brown, MD; Chandler, MD; Ettefagh, MD; Grant, MD; Lester, MD; McCormick, MD; McQueen, MD; Prose, MD; Simha, MD; Stanley, MD; Stryffeler, NP; Tebben, NP o [address], [place], [place] [address] o [phone] o Mon, Tue, Thur, Fri 8:30-5:30, Wed 9:30-5:30, Sat 8:30-12:30 o Babies seen by [facility] providers o Accepting Medicaid o Only accepting infants of first-time parents or siblings of current patients o Hospital discharge coordinator will make follow-up appointment . Jack Amos o [address],  [place], [place]  [address] o [phone]   Fax - [phone] . [facility] o [address], [place], [place]  [address] o Phone - [phone]   Fax - [phone] . Shilpa Gosrani o [address], [place], [place]  [address] o [phone]  [place] ([address]) . [facility] o Bates, MD; [place], MD; Cooper, Cox, MD; MD; Davis, MD; Dovico, MD; Ettefaugh, MD; Little, MD; Lowe, MD; Keiffer, MD; Melvin, MD; Sumner, MD; Williams, MD o [address], [place], [place] [address] o [phone] o Mon-Fri 8:30-5:00 (extended evenings Mon-Thur as needed), Sat-Sun 10:00-1:00 o Providers come to see babies at [facility] o Accepting Medicaid for families of first-time babies and families with all children in the household age 3 and under. Must register with office prior to making appointment (M-F only). . [facility] o Henson, NP; Knapp, MD; Lalonde, MD; Tysinger, PA o [address] [place], [place] [address] o [phone] o Mon-Fri 8:00-5:00 o Babies seen by providers at [facility] o Does NOT accept Medicaid/Commercial Insurance Only . [facility] ([facility])  o Artis, MD; Barnes, MD; Bratton, MD; Coccaro, MD; Lockett Gardner, MD; Kramer, MD; Marshall, MD; Netherton, MD; Poleto, MD; Skinner, MD o [address] [place], [place] [address] o [phone] o Mon-Fri 8:30-5:30, Sat (Oct.-Mar.) 9:00-1:00 o Babies seen by providers at [facility] o Accepting Medicaid  [place] ([address]) . [facility] o Reid, MD; Warner, MD o [address], [place],  [address] o [phone] o Mon-Fri 8:30-5:00, Sat 8:30-12:00 o Providers come to see babies at [facility] o Does NOT accept Medicaid . [facility] Ricci Barker, PA; Mannie Stabile, MD; [place], [place]; Nancy Fetter, MD; Moreen Fowler, [place],  [place], [place] [address] o [phone] o Mon-Fri 8:00-5:00 o Babies seen by providers at [facility] o Does NOT accept Medicaid o Only accepting babies of parents who are patients o Please call early in hospitalization for appointment (limited availability) . [facility] Pediatricians Blanca Friend, MD; Sharlene Motts, MD; Rod Can, MD; Warner Mccreedy, NP; Sabra Heck, MD; Ermalinda Memos, MD; Sharlett Iles, NP; Aurther Loft, MD; Jerrye Beavers, MD; Marcello Moores, MD; Berline Lopes, MD; Charolette Forward, MD o [place]. [place], [place], [place] [address] o [phone] o Mon-Fri 8:00-5:00, Sat 9:00-12:00 o Providers come to see babies at [facility] o Does NOT accept [facility] [phone]) . [facility] providers accepting new patients: Dayna Ramus, NP; [place], [place], [place], [place] [address] o [phone] o Mon-Fri 8:00-5:00 o Babies seen by providers at [facility] o Does NOT accept Medicaid o Only accepting babies of parents who are patients o Please call early in hospitalization for appointment (limited availability) . [facility] Oswaldo Conroy, MD; Sheran Lawless, MD o [address] [place], [place] [address] o [phone] (press 1 to schedule appointment) o Mon-Fri 8:00-5:00 o Providers come to see babies at [facility] o Does NOT accept Medicaid . [facility] Jodi Mourning, MD o [address] [place], [place] [address] o [phone] o Mon-Fri 8:30-5:00 (lunch 12:30-1:00), extended hours by appointment only Wed 5:00-6:30 o Babies seen by [facility] providers o Accepting Medicaid . [place] [facility] Evalyn Casco, MD; [place], MD; Ethlyn Gallery, MD o [place], [place], [place] [address] o [phone] o Mon-Fri 8:00-5:00 o Babies seen by [facility] providers o Does NOT accept Medicaid . Therapist, music at [place], MD; Yong Channel, MD; [place], [place] [address] [place], [place] [address] o  [phone] o Mon-Fri 8:00-5:00 o Babies seen by [facility] providers o Does NOT accept Medicaid . [place], [place]; [place], [place]; [place], NP; Albertina Parr, MD; Frederic Jericho, MD; Ronney Lion, MD; Carlos Levering, NP; Jerelene Redden, NP; Tomasita Crumble, NP; Ronelle Nigh, NP; Corinna Lines, MD; [place], MD o [address] [place], [place] [address] o [phone] o Mon-Fri 8:30-5:00, Sat 10:00-1:00 o Providers come to see babies at [facility] o Does NOT accept Medicaid o Free prenatal information session Tuesdays at 4:45pm . [facility] Porfirio Oar, MD; [place], [place]; [place], [place]; [place], [place]., [place] [address] o [phone] o Mon-Fri 7:30-5:30 o Babies seen by [facility] providers . [facility] Children[facility] Doctor o [address], [place], [place], [place]  [address] o [phone]   Fax - [phone]  [place] [phone] & [phone]) . [place], MD o [address] [place], [place] [address] o [phone] o Mon-Thur 8:00-6:00 o Providers come to see babies at [place] Medicaid . [place], NP; Melford Aase, MD; [place], [place]; [place], [address] [place], [place] [address] o [phone] o Mon-Thur 7:30-7:30, Fri 7:30-4:30 o Babies seen by [facility] providers o Accepting Medicaid . [facility] Nyra Jabs, MD; Cristino Martes, NP; Gertie Baron, MD o [place] [address], [place], [place] [address] o [phone] o Mon-Fri 8:30-5:00, Sat 8:30-12:00 o Providers come to see babies at [place] Medicaid o Must have "Meet & Greet" appointment at office prior to delivery . [place] ([place]) o Hubbard,  MD; Juleen China, MD; Clydene Laming, [place] [address], [place], [place] [address] o [phone] o Mon-Wed 8:00-6:00, Thur-Fri 8:00-5:00, Sat 9:00-12:00 o Providers come to see  babies at [facility] o Does NOT accept Medicaid o Only accepting siblings of current patients . [facility] [place] [facility], [place], [place], [place]  [address] o [phone]   Fax [phone] . [place] [facility] [place] [address], [place], [place]  [address] o [phone]   Fax - Artois Craig [phone] & [phone]) . Therapist, music at [place], DO; [place], [address] [place], [place] [address] o [phone] o Mon-Fri 7:00-5:00 o Babies seen by [facility] providers o Does NOT accept Medicaid . Cochrane, MD; [place], [place]; [place], [place], [place], [place] [address] o [phone] o Mon-Fri 8:00-5:00 o Babies seen by [facility] providers o Accepting Medicaid . Reed, MD; [place], [place]; [place], NP; [place], [place], [place], [place] [address] o [phone] o Mon-Fri 8:00-5:00 o Babies seen by providers at [facility] [place] [phone]) . [facility] [place], [place] o [address] [place], [place] [address] o [phone] o Mon-Fri 8:00-5:00 o Babies seen by [facility] providers o Does NOT accept Medicaid o Limited availability, please call early in hospitalization to schedule follow-up . [facility] Leilani Merl, PA; Maisie Fus, MD; [place], MD; [place], [place]; Jeannine Kitten, MD; [place], [place]y [facility] [address], [place], [place] [address] o [phone] o Mon-Fri 8:30-5:00, Sat 9:00-12:00 o Babies seen by providers at [facility] o Accepting Medicaid o Please register online then schedule online or call office o [email or web address] . [place] [facility] [place]) Kristian Covey, NP; Dwyane Dee, MD; Leonidas Romberg, PA o [address] [place], [place], [place] [address] o [phone] o Mon-Fri 8:00-5:00 o Babies seen by providers at [facility] o Accepting Medicaid . [place] ([facility]) Dairl Ponder, MD; Rayvon Char, NP; Melina Modena, MD o [address] [place], [place], [place] [address] o [phone] o Mon-Fri 8:00-5:30, Sat&Sun by appointment (phones open at 8:30) o Babies seen by [facility] providers o Accepting Medicaid o Must be a first-time baby or sibling of current patient . [place], [address], [place], [place]  [address] o [phone]   Fax - [phone]  [place] [phone] & [phone]) . [place], [place]; [place], [place]; Benjamine Mola, MD; [place], [place]; Harrell Lark, MD o [address], [place], [place] [address] o [phone] o Mon-Thur 8:00-7:00, Fri 8:00-5:00, Sat 8:00-12:00, Sun 9:00-12:00 o Babies seen by [facility] providers o Accepting Medicaid . [facility], MD; Ruthann Cancer, MD; [facility], MD o [address], [place],  [address] o [phone] o Mon-Thur 8:00-5:00 o Babies seen by providers at [facility] o Accepting Medicaid . [facility] [place], MD; Coe-Goins, MD; Amedeo Plenty, MD; Bobby Rumpf, MD; List, MD; Lavonia Drafts, MD; Ruthann Cancer, MD; Selinda Eon, MD; Audie Box, MD; Jim Like, MD; Christie Nottingham, MD; [place], MD; [place]ena Nunnery, MD o [address] [place], [place]  [address] o [phone] o Mon-Fri 8:00-5:30, Sat (Oct.-Mar.) 9:00-1:00 o Babies seen by providers at [facility] o Accepting Medicaid o Must fill out new patient packet, available online at [email or web address] . [facility] ([facility] o Friddle, NP; Harris, NP; Kelly, NP; Logan, MD;  [place]vin, PA; Poth, MD; Ramadoss, MD; Stanton, NP o [address], [place], [place] [address] o [phone] o Mon-Thur 8:00-5:30, Fri 8:00-5:00 o Babies seen by providers at [facility] o Accepting Medicaid  [place] ([address]) . [facility] o Dixon, PA; B[place], MD; Pickard, MD; Tapia, PA o [address], [place], [place] [address] o [phone] o Mon-Fri 8:00-5:00 o Babies seen by providers at [facility] o Accepting Medicaid   [place] ([address]) . [facility] [place] o Masneri, DO; Meyers, MD; Nelson, PA o [address], [place], [place] [address] o [phone] o Mon-Fri 8:00-5:00 o Babies seen by providers at [facility] o Does NOT accept Medicaid o Limited appointment availability, please call early in hospitalization  . [facility] [place] o Kunedd, DO; McGowen, MD o [address], [place], [place] [address] o [phone] o Mon-Fri 8:00-5:00 o Babies seen by [facility] providers o Does NOT accept Medicaid . [facility] [place] o Cameron, MD; MacDonald, MD; Michaels, PA; Nayak, MD o [address] [place] [address], [place], [place] [address] o [phone] o Mon-Fri 8:00-5:00 o After hours clinic ([address] [place], [place] [address]) [phone] Mon-Fri 5:00-8:00, Sat 12:00-6:00, Sun 10:00-4:00 o Babies seen by [facility] providers o Accepting Medicaid . [facility] [place] o [address], [place], [place]  [address] o [phone]   Fax - [phone]  [place] ([address]) . [facility] [place] o Andy, MD o [address], [place], [place] [address] o [phone] o Mon-Fri 8:00-5:00 o Babies seen by [facility] providers o Does NOT accept Medicaid . [facility] [place] ([facility] [place]) o Eksir, MD o [address], [place], [place]  [address] o [phone] o Mon-Thur 8:00-7:00, Fri 8:00-5:00, Sat 8:00-12:00 o Babies seen by providers at [facility] o Accepting Medicaid - but does not have vaccinations in office (must be received elsewhere) o Limited availability, please call early in hospitalization  [place] ([address]) . [facility]  o Charlene Flemming, MD o [address], [place] [address] o [phone]  Fax [phone]       Third Trimester of Pregnancy The third trimester is from week 28 through week 40 (months 7 through 9). The third trimester is a time when the unborn baby (fetus) is growing rapidly. At the end of the ninth month, the fetus is about 20 inches in length and weighs 6-10 pounds. Body changes during your third trimester Your body will continue to go through many changes during pregnancy. The changes vary from woman to woman. During the third trimester:  Your weight will continue to increase. You can expect to gain 25-35 pounds (11-16 kg) by the end of the pregnancy.  You may begin to get stretch marks on your hips, abdomen, and breasts.  You may urinate more often because the fetus is moving lower into your pelvis and pressing on your bladder.  You may develop or continue to have heartburn. This is caused by increased hormones that slow down muscles in the digestive   tract.  You may develop or continue to have constipation because increased hormones slow digestion and cause the muscles that push waste through your intestines to relax.  You may develop hemorrhoids. These are swollen veins (varicose veins) in the rectum that can itch or be painful.  You may develop swollen, bulging veins (varicose veins) in your legs.  You may have increased body aches in the pelvis, back, or thighs. This is due to weight gain and increased hormones that are relaxing your joints.  You may have changes in your hair. These can include thickening of your hair, rapid growth, and changes  in texture. Some women also have hair loss during or after pregnancy, or hair that feels dry or thin. Your hair will most likely return to normal after your baby is born.  Your breasts will continue to grow and they will continue to become tender. A yellow fluid (colostrum) may leak from your breasts. This is the first milk you are producing for your baby.  Your belly button may stick out.  You may notice more swelling in your hands, face, or ankles.  You may have increased tingling or numbness in your hands, arms, and legs. The skin on your belly may also feel numb.  You may feel short of breath because of your expanding uterus.  You may have more problems sleeping. This can be caused by the size of your belly, increased need to urinate, and an increase in your body's metabolism.  You may notice the fetus "dropping," or moving lower in your abdomen (lightening).  You may have increased vaginal discharge.  You may notice your joints feel loose and you may have pain around your pelvic bone. What to expect at prenatal visits You will have prenatal exams every 2 weeks until week 36. Then you will have weekly prenatal exams. During a routine prenatal visit:  You will be weighed to make sure you and the baby are growing normally.  Your blood pressure will be taken.  Your abdomen will be measured to track your baby's growth.  The fetal heartbeat will be listened to.  Any test results from the previous visit will be discussed.  You may have a cervical check near your due date to see if your cervix has softened or thinned (effaced).  You will be tested for Group B streptococcus. This happens between 35 and 37 weeks. Your health care provider may ask you:  What your birth plan is.  How you are feeling.  If you are feeling the baby move.  If you have had any abnormal symptoms, such as leaking fluid, bleeding, severe headaches, or abdominal cramping.  If you are using any tobacco  products, including cigarettes, chewing tobacco, and electronic cigarettes.  If you have any questions. Other tests or screenings that may be performed during your third trimester include:  Blood tests that check for low iron levels (anemia).  Fetal testing to check the health, activity level, and growth of the fetus. Testing is done if you have certain medical conditions or if there are problems during the pregnancy.  Nonstress test (NST). This test checks the health of your baby to make sure there are no signs of problems, such as the baby not getting enough oxygen. During this test, a belt is placed around your belly. The baby is made to move, and its heart rate is monitored during movement. What is false labor? False labor is a condition in which you feel small, irregular tightenings of   the muscles in the womb (contractions) that usually go away with rest, changing position, or drinking water. These are called Braxton Hicks contractions. Contractions may last for hours, days, or even weeks before true labor sets in. If contractions come at regular intervals, become more frequent, increase in intensity, or become painful, you should see your health care provider. What are the signs of labor?  Abdominal cramps.  Regular contractions that start at 10 minutes apart and become stronger and more frequent with time.  Contractions that start on the top of the uterus and spread down to the lower abdomen and back.  Increased pelvic pressure and dull back pain.  A watery or bloody mucus discharge that comes from the vagina.  Leaking of amniotic fluid. This is also known as your "water breaking." It could be a slow trickle or a gush. Let your health care provider know if it has a color or strange odor. If you have any of these signs, call your health care provider right away, even if it is before your due date. Follow these instructions at home: Medicines  Follow your health care provider's  instructions regarding medicine use. Specific medicines may be either safe or unsafe to take during pregnancy.  Take a prenatal vitamin that contains at least 600 micrograms (mcg) of folic acid.  If you develop constipation, try taking a stool softener if your health care provider approves. Eating and drinking   Eat a balanced diet that includes fresh fruits and vegetables, whole grains, good sources of protein such as meat, eggs, or tofu, and low-fat dairy. Your health care provider will help you determine the amount of weight gain that is right for you.  Avoid raw meat and uncooked cheese. These carry germs that can cause birth defects in the baby.  If you have low calcium intake from food, talk to your health care provider about whether you should take a daily calcium supplement.  Eat four or five small meals rather than three large meals a day.  Limit foods that are high in fat and processed sugars, such as fried and sweet foods.  To prevent constipation: ? Drink enough fluid to keep your urine clear or pale yellow. ? Eat foods that are high in fiber, such as fresh fruits and vegetables, whole grains, and beans. Activity  Exercise only as directed by your health care provider. Most women can continue their usual exercise routine during pregnancy. Try to exercise for 30 minutes at least 5 days a week. Stop exercising if you experience uterine contractions.  Avoid heavy lifting.  Do not exercise in extreme heat or humidity, or at high altitudes.  Wear low-heel, comfortable shoes.  Practice good posture.  You may continue to have sex unless your health care provider tells you otherwise. Relieving pain and discomfort  Take frequent breaks and rest with your legs elevated if you have leg cramps or low back pain.  Take warm sitz baths to soothe any pain or discomfort caused by hemorrhoids. Use hemorrhoid cream if your health care provider approves.  Wear a good support bra to  prevent discomfort from breast tenderness.  If you develop varicose veins: ? Wear support pantyhose or compression stockings as told by your healthcare provider. ? Elevate your feet for 15 minutes, 3-4 times a day. Prenatal care  Write down your questions. Take them to your prenatal visits.  Keep all your prenatal visits as told by your health care provider. This is important. Safety  Wear your   seat belt at all times when driving.  Make a list of emergency phone numbers, including numbers for family, friends, the hospital, and police and fire departments. General instructions  Avoid cat litter boxes and soil used by cats. These carry germs that can cause birth defects in the baby. If you have a cat, ask someone to clean the litter box for you.  Do not travel far distances unless it is absolutely necessary and only with the approval of your health care provider.  Do not use hot tubs, steam rooms, or saunas.  Do not drink alcohol.  Do not use any products that contain nicotine or tobacco, such as cigarettes and e-cigarettes. If you need help quitting, ask your health care provider.  Do not use any medicinal herbs or unprescribed drugs. These chemicals affect the formation and growth of the baby.  Do not douche or use tampons or scented sanitary pads.  Do not cross your legs for long periods of time.  To prepare for the arrival of your baby: ? Take prenatal classes to understand, practice, and ask questions about labor and delivery. ? Make a trial run to the hospital. ? Visit the hospital and tour the maternity area. ? Arrange for maternity or paternity leave through employers. ? Arrange for family and friends to take care of pets while you are in the hospital. ? Purchase a rear-facing car seat and make sure you know how to install it in your car. ? Pack your hospital bag. ? Prepare the baby's nursery. Make sure to remove all pillows and stuffed animals from the baby's crib to  prevent suffocation.  Visit your dentist if you have not gone during your pregnancy. Use a soft toothbrush to brush your teeth and be gentle when you floss. Contact a health care provider if:  You are unsure if you are in labor or if your water has broken.  You become dizzy.  You have mild pelvic cramps, pelvic pressure, or nagging pain in your abdominal area.  You have lower back pain.  You have persistent nausea, vomiting, or diarrhea.  You have an unusual or bad smelling vaginal discharge.  You have pain when you urinate. Get help right away if:  Your water breaks before 37 weeks.  You have regular contractions less than 5 minutes apart before 37 weeks.  You have a fever.  You are leaking fluid from your vagina.  You have spotting or bleeding from your vagina.  You have severe abdominal pain or cramping.  You have rapid weight loss or weight gain.  You have shortness of breath with chest pain.  You notice sudden or extreme swelling of your face, hands, ankles, feet, or legs.  Your baby makes fewer than 10 movements in 2 hours.  You have severe headaches that do not go away when you take medicine.  You have vision changes. Summary  The third trimester is from week 28 through week 40, months 7 through 9. The third trimester is a time when the unborn baby (fetus) is growing rapidly.  During the third trimester, your discomfort may increase as you and your baby continue to gain weight. You may have abdominal, leg, and back pain, sleeping problems, and an increased need to urinate.  During the third trimester your breasts will keep growing and they will continue to become tender. A yellow fluid (colostrum) may leak from your breasts. This is the first milk you are producing for your baby.  False labor is a   condition in which you feel small, irregular tightenings of the muscles in the womb (contractions) that eventually go away. These are called Braxton Hicks  contractions. Contractions may last for hours, days, or even weeks before true labor sets in.  Signs of labor can include: abdominal cramps; regular contractions that start at 10 minutes apart and become stronger and more frequent with time; watery or bloody mucus discharge that comes from the vagina; increased pelvic pressure and dull back pain; and leaking of amniotic fluid. This information is not intended to replace advice given to you by your health care provider. Make sure you discuss any questions you have with your health care provider. Document Released: 01/31/2001 Document Revised: 03/14/2016 Document Reviewed: 03/14/2016 Elsevier Interactive Patient Education  2019 Elsevier Inc.  
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
## 2018-05-04 LAB — CBC
Hematocrit: 33.2 % — ABNORMAL LOW (ref 34.0–46.6)
Hemoglobin: 10.9 g/dL — ABNORMAL LOW (ref 11.1–15.9)
MCH: 29 pg (ref 26.6–33.0)
MCHC: 32.8 g/dL (ref 31.5–35.7)
MCV: 88 fL (ref 79–97)
Platelets: 290 10*3/uL (ref 150–450)
RBC: 3.76 x10E6/uL — ABNORMAL LOW (ref 3.77–5.28)
RDW: 12 % (ref 11.7–15.4)
WBC: 5.8 10*3/uL (ref 3.4–10.8)

## 2018-05-04 LAB — GLUCOSE TOLERANCE, 2 HOURS W/ 1HR
GLUCOSE, 1 HOUR: 81 mg/dL (ref 65–179)
Glucose, 2 hour: 53 mg/dL — ABNORMAL LOW (ref 65–152)
Glucose, Fasting: 72 mg/dL (ref 65–91)

## 2018-05-04 LAB — RPR: RPR Ser Ql: NONREACTIVE

## 2018-05-04 LAB — HIV ANTIBODY (ROUTINE TESTING W REFLEX): HIV Screen 4th Generation wRfx: NONREACTIVE

## 2018-05-07 ENCOUNTER — Other Ambulatory Visit: Payer: Self-pay

## 2018-05-07 ENCOUNTER — Ambulatory Visit (HOSPITAL_COMMUNITY)
Admission: RE | Admit: 2018-05-07 | Discharge: 2018-05-07 | Disposition: A | Discharge status: Home / Self Care | Payer: Medicaid Other | Source: Ambulatory Visit | Attending: Advanced Practice Midwife | Admitting: Advanced Practice Midwife

## 2018-05-07 DIAGNOSIS — Z362 Encounter for other antenatal screening follow-up: Secondary | ICD-10-CM

## 2018-05-07 DIAGNOSIS — Z3A30 30 weeks gestation of pregnancy: Secondary | ICD-10-CM | POA: Diagnosis present

## 2018-05-07 DIAGNOSIS — Z3403 Encounter for supervision of normal first pregnancy, third trimester: Secondary | ICD-10-CM | POA: Insufficient documentation

## 2018-05-07 DIAGNOSIS — O43199 Other malformation of placenta, unspecified trimester: Secondary | ICD-10-CM | POA: Insufficient documentation

## 2018-05-07 DIAGNOSIS — Z3A29 29 weeks gestation of pregnancy: Secondary | ICD-10-CM

## 2018-05-07 DIAGNOSIS — Z8279 Family history of other congenital malformations, deformations and chromosomal abnormalities: Secondary | ICD-10-CM | POA: Diagnosis present

## 2018-05-08 ENCOUNTER — Other Ambulatory Visit (HOSPITAL_COMMUNITY): Payer: Self-pay | Admitting: *Deleted

## 2018-05-08 DIAGNOSIS — O403XX0 Polyhydramnios, third trimester, not applicable or unspecified: Secondary | ICD-10-CM

## 2018-05-10 ENCOUNTER — Encounter: Payer: Self-pay | Admitting: Advanced Practice Midwife

## 2018-05-10 DIAGNOSIS — O409XX0 Polyhydramnios, unspecified trimester, not applicable or unspecified: Secondary | ICD-10-CM | POA: Insufficient documentation

## 2018-05-10 DIAGNOSIS — Z8759 Personal history of other complications of pregnancy, childbirth and the puerperium: Secondary | ICD-10-CM | POA: Insufficient documentation

## 2018-05-14 ENCOUNTER — Encounter: Payer: Self-pay | Admitting: *Deleted

## 2018-05-21 ENCOUNTER — Ambulatory Visit (HOSPITAL_COMMUNITY)
Admission: RE | Admit: 2018-05-21 | Discharge: 2018-05-21 | Disposition: A | Discharge status: Home / Self Care | Payer: Medicaid Other | Source: Ambulatory Visit | Attending: Obstetrics and Gynecology | Admitting: Obstetrics and Gynecology

## 2018-05-21 ENCOUNTER — Ambulatory Visit (HOSPITAL_COMMUNITY): Payer: Medicaid Other | Admitting: *Deleted

## 2018-05-21 ENCOUNTER — Other Ambulatory Visit: Payer: Self-pay

## 2018-05-21 ENCOUNTER — Other Ambulatory Visit (HOSPITAL_COMMUNITY): Payer: Self-pay | Admitting: *Deleted

## 2018-05-21 DIAGNOSIS — Z049 Encounter for examination and observation for unspecified reason: Secondary | ICD-10-CM

## 2018-05-21 DIAGNOSIS — O409XX0 Polyhydramnios, unspecified trimester, not applicable or unspecified: Secondary | ICD-10-CM | POA: Diagnosis present

## 2018-05-21 DIAGNOSIS — Z3403 Encounter for supervision of normal first pregnancy, third trimester: Secondary | ICD-10-CM | POA: Diagnosis present

## 2018-05-21 DIAGNOSIS — Z362 Encounter for other antenatal screening follow-up: Secondary | ICD-10-CM | POA: Diagnosis not present

## 2018-05-21 DIAGNOSIS — O403XX0 Polyhydramnios, third trimester, not applicable or unspecified: Secondary | ICD-10-CM | POA: Diagnosis present

## 2018-05-21 DIAGNOSIS — Z3A31 31 weeks gestation of pregnancy: Secondary | ICD-10-CM

## 2018-05-21 NOTE — Progress Notes (Signed)
States she has experienced small amounts of clear fluid from vagina X 2, several days apart.

## 2018-05-24 ENCOUNTER — Other Ambulatory Visit (HOSPITAL_COMMUNITY): Payer: Self-pay | Admitting: *Deleted

## 2018-05-24 DIAGNOSIS — O409XX0 Polyhydramnios, unspecified trimester, not applicable or unspecified: Secondary | ICD-10-CM

## 2018-05-24 NOTE — Progress Notes (Unsigned)
.  mfm

## 2018-05-27 ENCOUNTER — Encounter (HOSPITAL_COMMUNITY): Payer: Self-pay

## 2018-05-27 ENCOUNTER — Ambulatory Visit (HOSPITAL_COMMUNITY): Payer: Medicaid Other

## 2018-06-04 ENCOUNTER — Ambulatory Visit (HOSPITAL_COMMUNITY): Payer: Medicaid Other

## 2018-06-17 ENCOUNTER — Telehealth: Payer: Self-pay | Admitting: Advanced Practice Midwife

## 2018-06-17 NOTE — Telephone Encounter (Signed)
The patient has not been seen sine 3/13. She is 35 weeks, I scheduled the patient in a new gyn area to see her as soon as possible. Called the patient with the upcoming appointment information.

## 2018-06-17 NOTE — Telephone Encounter (Signed)
Left a detailed voicemail message concerning our new location and upcoming appointment information.

## 2018-06-19 ENCOUNTER — Encounter (HOSPITAL_COMMUNITY): Payer: Self-pay

## 2018-06-19 ENCOUNTER — Other Ambulatory Visit: Payer: Self-pay

## 2018-06-19 ENCOUNTER — Ambulatory Visit (HOSPITAL_COMMUNITY): Payer: Medicaid Other | Admitting: *Deleted

## 2018-06-19 ENCOUNTER — Ambulatory Visit (HOSPITAL_COMMUNITY)
Admission: RE | Admit: 2018-06-19 | Discharge: 2018-06-19 | Disposition: A | Discharge status: Home / Self Care | Payer: Medicaid Other | Source: Ambulatory Visit | Attending: Obstetrics and Gynecology | Admitting: Obstetrics and Gynecology

## 2018-06-19 VITALS — BP 122/75 | HR 92 | Temp 98.4°F

## 2018-06-19 DIAGNOSIS — Z3A35 35 weeks gestation of pregnancy: Secondary | ICD-10-CM

## 2018-06-19 DIAGNOSIS — O403XX0 Polyhydramnios, third trimester, not applicable or unspecified: Secondary | ICD-10-CM | POA: Insufficient documentation

## 2018-06-19 DIAGNOSIS — Z362 Encounter for other antenatal screening follow-up: Secondary | ICD-10-CM

## 2018-06-19 DIAGNOSIS — Z3403 Encounter for supervision of normal first pregnancy, third trimester: Secondary | ICD-10-CM | POA: Insufficient documentation

## 2018-06-19 DIAGNOSIS — Z049 Encounter for examination and observation for unspecified reason: Secondary | ICD-10-CM

## 2018-06-19 DIAGNOSIS — O409XX0 Polyhydramnios, unspecified trimester, not applicable or unspecified: Secondary | ICD-10-CM | POA: Diagnosis present

## 2018-06-20 ENCOUNTER — Other Ambulatory Visit (HOSPITAL_COMMUNITY): Payer: Self-pay | Admitting: *Deleted

## 2018-06-20 DIAGNOSIS — O403XX0 Polyhydramnios, third trimester, not applicable or unspecified: Secondary | ICD-10-CM

## 2018-06-24 ENCOUNTER — Telehealth: Payer: Self-pay | Admitting: Family Medicine

## 2018-06-24 ENCOUNTER — Encounter: Payer: Self-pay | Admitting: Family Medicine

## 2018-06-24 ENCOUNTER — Other Ambulatory Visit: Payer: Medicaid Other

## 2018-06-24 NOTE — Telephone Encounter (Signed)
Called the patient with an upcoming appointment information for Wednesday and also informed the patient of the new location. Patient verbalized understanding.

## 2018-06-24 NOTE — Telephone Encounter (Signed)
Called the patient with an appointment reminder.

## 2018-06-24 NOTE — Telephone Encounter (Signed)
Called the patient with an reminder call regarding the appointment. Stated she did not receive the mychart reminder. After reviewing her mychart messages it is found the patient reviewed the message when it was initially sent 06/17/2018.

## 2018-06-26 ENCOUNTER — Ambulatory Visit: Payer: Self-pay

## 2018-06-26 ENCOUNTER — Ambulatory Visit (INDEPENDENT_AMBULATORY_CARE_PROVIDER_SITE_OTHER): Payer: Medicaid Other | Admitting: *Deleted

## 2018-06-26 ENCOUNTER — Other Ambulatory Visit: Payer: Self-pay

## 2018-06-26 VITALS — BP 140/84 | HR 89 | Wt 174.8 lb

## 2018-06-26 DIAGNOSIS — Z3A36 36 weeks gestation of pregnancy: Secondary | ICD-10-CM | POA: Diagnosis not present

## 2018-06-26 DIAGNOSIS — O163 Unspecified maternal hypertension, third trimester: Secondary | ICD-10-CM

## 2018-06-26 DIAGNOSIS — O403XX0 Polyhydramnios, third trimester, not applicable or unspecified: Secondary | ICD-10-CM | POA: Diagnosis not present

## 2018-06-26 NOTE — Progress Notes (Signed)
Pt informed that the ultrasound is considered a limited OB ultrasound and is not intended to be a complete ultrasound exam.  Patient also informed that the ultrasound is not being completed with the intent of assessing for fetal or placental anomalies or any pelvic abnormalities.  Explained that the purpose of today's ultrasound is to assess for presentation, BPP and amniotic fluid volume.  Patient acknowledges the purpose of the exam and the limitations of the study.    Pt has had 26 lb weight gain since last visit (3/13). BP is mildly elevated today. She denies H/A or visual disturbances. BP values discussed w/Dr. Marice Potter - pre-e labs done.

## 2018-06-27 ENCOUNTER — Ambulatory Visit (HOSPITAL_COMMUNITY): Payer: Medicaid Other

## 2018-06-27 ENCOUNTER — Encounter (HOSPITAL_COMMUNITY): Payer: Self-pay

## 2018-06-27 LAB — CBC
Hematocrit: 30.1 % — ABNORMAL LOW (ref 34.0–46.6)
Hemoglobin: 9.8 g/dL — ABNORMAL LOW (ref 11.1–15.9)
MCH: 25.8 pg — ABNORMAL LOW (ref 26.6–33.0)
MCHC: 32.6 g/dL (ref 31.5–35.7)
MCV: 79 fL (ref 79–97)
Platelets: 294 10*3/uL (ref 150–450)
RBC: 3.8 x10E6/uL (ref 3.77–5.28)
RDW: 12.5 % (ref 11.7–15.4)
WBC: 8.1 10*3/uL (ref 3.4–10.8)

## 2018-06-27 LAB — PROTEIN / CREATININE RATIO, URINE
Creatinine, Urine: 104.1 mg/dL
Protein, Ur: 42 mg/dL
Protein/Creat Ratio: 403 mg/g creat — ABNORMAL HIGH (ref 0–200)

## 2018-06-27 LAB — COMP. METABOLIC PANEL (12)
AST: 21 IU/L (ref 0–40)
Albumin/Globulin Ratio: 1.3 (ref 1.2–2.2)
Albumin: 3.3 g/dL — ABNORMAL LOW (ref 3.9–5.0)
Alkaline Phosphatase: 114 IU/L (ref 39–117)
BUN/Creatinine Ratio: 17 (ref 9–23)
BUN: 12 mg/dL (ref 6–20)
Bilirubin Total: <0.2 mg/dL (ref 0.0–1.2)
Calcium: 9.6 mg/dL (ref 8.7–10.2)
Chloride: 104 mmol/L (ref 96–106)
Creatinine, Ser: 0.69 mg/dL (ref 0.57–1.00)
GFR calc Af Amer: 146 mL/min/{1.73_m2} (ref >59)
GFR calc non Af Amer: 127 mL/min/{1.73_m2} (ref >59)
Globulin, Total: 2.5 g/dL (ref 1.5–4.5)
Glucose: 66 mg/dL (ref 65–99)
Potassium: 4.5 mmol/L (ref 3.5–5.2)
Sodium: 134 mmol/L (ref 134–144)
Total Protein: 5.8 g/dL — ABNORMAL LOW (ref 6.0–8.5)

## 2018-06-28 ENCOUNTER — Telehealth: Payer: Self-pay | Admitting: *Deleted

## 2018-06-28 ENCOUNTER — Ambulatory Visit: Payer: Medicaid Other

## 2018-06-28 NOTE — Telephone Encounter (Signed)
Opened in error

## 2018-06-28 NOTE — Telephone Encounter (Signed)
Called pt regarding her appointment for BP check.  Pt did not pick up.  Advised pt that the clinic closes at 12pm and if she is unable to make it by this time then she will need to go to [facility] to be evaluated.

## 2018-07-01 ENCOUNTER — Telehealth: Payer: Self-pay | Admitting: Obstetrics & Gynecology

## 2018-07-01 NOTE — Telephone Encounter (Signed)
I called and left a voicemail that she should be seen in the office today.

## 2018-07-01 NOTE — Telephone Encounter (Signed)
Called the patient to confirm the appointment, the patient verbalized understating. °

## 2018-07-04 ENCOUNTER — Ambulatory Visit (INDEPENDENT_AMBULATORY_CARE_PROVIDER_SITE_OTHER): Payer: Medicaid Other | Admitting: Obstetrics and Gynecology

## 2018-07-04 ENCOUNTER — Encounter: Payer: Self-pay | Admitting: Obstetrics and Gynecology

## 2018-07-04 ENCOUNTER — Other Ambulatory Visit (HOSPITAL_COMMUNITY)
Admission: RE | Admit: 2018-07-04 | Discharge: 2018-07-04 | Disposition: A | Discharge status: Home / Self Care | Payer: Medicaid Other | Source: Ambulatory Visit | Attending: Obstetrics and Gynecology | Admitting: Obstetrics and Gynecology

## 2018-07-04 ENCOUNTER — Encounter (HOSPITAL_COMMUNITY): Payer: Self-pay | Admitting: *Deleted

## 2018-07-04 ENCOUNTER — Other Ambulatory Visit (HOSPITAL_COMMUNITY): Payer: Self-pay | Admitting: *Deleted

## 2018-07-04 ENCOUNTER — Other Ambulatory Visit: Payer: Self-pay

## 2018-07-04 ENCOUNTER — Ambulatory Visit: Payer: Self-pay

## 2018-07-04 ENCOUNTER — Telehealth (HOSPITAL_COMMUNITY): Payer: Self-pay | Admitting: *Deleted

## 2018-07-04 ENCOUNTER — Ambulatory Visit (INDEPENDENT_AMBULATORY_CARE_PROVIDER_SITE_OTHER): Payer: Medicaid Other | Admitting: General Practice

## 2018-07-04 ENCOUNTER — Other Ambulatory Visit: Payer: Self-pay | Admitting: Obstetrics and Gynecology

## 2018-07-04 VITALS — BP 128/84 | HR 73 | Temp 97.9°F | Wt 175.7 lb

## 2018-07-04 DIAGNOSIS — Z3A38 38 weeks gestation of pregnancy: Secondary | ICD-10-CM | POA: Diagnosis not present

## 2018-07-04 DIAGNOSIS — O0990 Supervision of high risk pregnancy, unspecified, unspecified trimester: Secondary | ICD-10-CM

## 2018-07-04 DIAGNOSIS — O409XX0 Polyhydramnios, unspecified trimester, not applicable or unspecified: Secondary | ICD-10-CM

## 2018-07-04 DIAGNOSIS — Z3A37 37 weeks gestation of pregnancy: Secondary | ICD-10-CM

## 2018-07-04 DIAGNOSIS — Z3403 Encounter for supervision of normal first pregnancy, third trimester: Secondary | ICD-10-CM | POA: Diagnosis present

## 2018-07-04 DIAGNOSIS — O403XX0 Polyhydramnios, third trimester, not applicable or unspecified: Secondary | ICD-10-CM

## 2018-07-04 NOTE — Progress Notes (Signed)
Pt informed that the ultrasound is considered a limited OB ultrasound and is not intended to be a complete ultrasound exam.  Patient also informed that the ultrasound is not being completed with the intent of assessing for fetal or placental anomalies or any pelvic abnormalities.  Explained that the purpose of today's ultrasound is to assess for  BPP, presentation and AFI.  Patient acknowledges the purpose of the exam and the limitations of the study.    IOL scheduled 5/23 @ 8am   Chase Caller RN BSN 07/04/18

## 2018-07-04 NOTE — Telephone Encounter (Signed)
Preadmission screen  
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
## 2018-07-05 LAB — GC/CHLAMYDIA PROBE AMP (~~LOC~~) NOT AT ARMC
Chlamydia: NEGATIVE
Neisseria Gonorrhea: NEGATIVE

## 2018-07-08 LAB — CULTURE, BETA STREP (GROUP B ONLY): Strep Gp B Culture: NEGATIVE

## 2018-07-09 ENCOUNTER — Telehealth: Payer: Self-pay | Admitting: Student

## 2018-07-09 NOTE — Telephone Encounter (Signed)
Called the patient to inform of the upcoming visit. The patient verbalized understanding °

## 2018-07-10 ENCOUNTER — Ambulatory Visit (INDEPENDENT_AMBULATORY_CARE_PROVIDER_SITE_OTHER): Payer: Medicaid Other | Admitting: Obstetrics & Gynecology

## 2018-07-10 ENCOUNTER — Ambulatory Visit: Payer: Self-pay

## 2018-07-10 ENCOUNTER — Other Ambulatory Visit (HOSPITAL_COMMUNITY)
Admission: RE | Admit: 2018-07-10 | Discharge: 2018-07-10 | Disposition: A | Discharge status: Home / Self Care | Payer: Medicaid Other | Source: Ambulatory Visit | Attending: Obstetrics and Gynecology | Admitting: Obstetrics and Gynecology

## 2018-07-10 ENCOUNTER — Other Ambulatory Visit: Payer: Self-pay

## 2018-07-10 ENCOUNTER — Ambulatory Visit (INDEPENDENT_AMBULATORY_CARE_PROVIDER_SITE_OTHER): Payer: Medicaid Other | Admitting: General Practice

## 2018-07-10 VITALS — HR 102 | Temp 98.5°F | Wt 180.1 lb

## 2018-07-10 VITALS — BP 138/78

## 2018-07-10 DIAGNOSIS — O403XX0 Polyhydramnios, third trimester, not applicable or unspecified: Secondary | ICD-10-CM | POA: Diagnosis not present

## 2018-07-10 DIAGNOSIS — O409XX0 Polyhydramnios, unspecified trimester, not applicable or unspecified: Secondary | ICD-10-CM

## 2018-07-10 DIAGNOSIS — Z3A38 38 weeks gestation of pregnancy: Secondary | ICD-10-CM

## 2018-07-10 DIAGNOSIS — O0990 Supervision of high risk pregnancy, unspecified, unspecified trimester: Secondary | ICD-10-CM
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
## 2018-07-10 NOTE — Progress Notes (Signed)
Pt informed that the ultrasound is considered a limited OB ultrasound and is not intended to be a complete ultrasound exam.  Patient also informed that the ultrasound is not being completed with the intent of assessing for fetal or placental anomalies or any pelvic abnormalities.  Explained that the purpose of today's ultrasound is to assess for  BPP, presentation and AFI.  Patient acknowledges the purpose of the exam and the limitations of the study.    Brien Lowe H RN BSN 07/10/18  

## 2018-07-12 ENCOUNTER — Other Ambulatory Visit: Payer: Self-pay | Admitting: Advanced Practice Midwife

## 2018-07-12 ENCOUNTER — Other Ambulatory Visit (HOSPITAL_COMMUNITY): Payer: Self-pay | Admitting: *Deleted

## 2018-07-13 ENCOUNTER — Inpatient Hospital Stay (HOSPITAL_COMMUNITY): Payer: Medicaid Other | Admitting: Anesthesiology

## 2018-07-13 ENCOUNTER — Other Ambulatory Visit: Payer: Self-pay

## 2018-07-13 ENCOUNTER — Inpatient Hospital Stay (HOSPITAL_COMMUNITY)
Admission: AD | Admit: 2018-07-13 | Discharge: 2018-07-16 | DRG: 807 | Disposition: A | Discharge status: Home / Self Care | Payer: Medicaid Other | Attending: Obstetrics & Gynecology | Admitting: Obstetrics & Gynecology

## 2018-07-13 ENCOUNTER — Inpatient Hospital Stay (HOSPITAL_COMMUNITY): Payer: Medicaid Other

## 2018-07-13 ENCOUNTER — Encounter (HOSPITAL_COMMUNITY): Payer: Self-pay | Admitting: *Deleted

## 2018-07-13 DIAGNOSIS — Z1159 Encounter for screening for other viral diseases: Secondary | ICD-10-CM | POA: Diagnosis not present

## 2018-07-13 DIAGNOSIS — O139 Gestational [pregnancy-induced] hypertension without significant proteinuria, unspecified trimester: Secondary | ICD-10-CM

## 2018-07-13 DIAGNOSIS — Z049 Encounter for examination and observation for unspecified reason: Secondary | ICD-10-CM

## 2018-07-13 DIAGNOSIS — O99334 Smoking (tobacco) complicating childbirth: Secondary | ICD-10-CM | POA: Diagnosis present

## 2018-07-13 DIAGNOSIS — O0990 Supervision of high risk pregnancy, unspecified, unspecified trimester: Secondary | ICD-10-CM

## 2018-07-13 DIAGNOSIS — O99320 Drug use complicating pregnancy, unspecified trimester: Secondary | ICD-10-CM

## 2018-07-13 DIAGNOSIS — O134 Gestational [pregnancy-induced] hypertension without significant proteinuria, complicating childbirth: Secondary | ICD-10-CM | POA: Diagnosis present

## 2018-07-13 DIAGNOSIS — Z8759 Personal history of other complications of pregnancy, childbirth and the puerperium: Secondary | ICD-10-CM

## 2018-07-13 DIAGNOSIS — Z3A39 39 weeks gestation of pregnancy: Secondary | ICD-10-CM

## 2018-07-13 DIAGNOSIS — F329 Major depressive disorder, single episode, unspecified: Secondary | ICD-10-CM

## 2018-07-13 DIAGNOSIS — O409XX0 Polyhydramnios, unspecified trimester, not applicable or unspecified: Secondary | ICD-10-CM | POA: Diagnosis present

## 2018-07-13 DIAGNOSIS — O99342 Other mental disorders complicating pregnancy, second trimester: Secondary | ICD-10-CM

## 2018-07-13 DIAGNOSIS — O403XX0 Polyhydramnios, third trimester, not applicable or unspecified: Secondary | ICD-10-CM | POA: Diagnosis present

## 2018-07-13 DIAGNOSIS — F17210 Nicotine dependence, cigarettes, uncomplicated: Secondary | ICD-10-CM | POA: Diagnosis present

## 2018-07-13 LAB — CBC
HCT: 32.2 % — ABNORMAL LOW (ref 36.0–46.0)
Hemoglobin: 9.8 g/dL — ABNORMAL LOW (ref 12.0–15.0)
MCH: 25 pg — ABNORMAL LOW (ref 26.0–34.0)
MCHC: 30.4 g/dL (ref 30.0–36.0)
MCV: 82.1 fL (ref 80.0–100.0)
Platelets: 275 10*3/uL (ref 150–400)
RBC: 3.92 MIL/uL (ref 3.87–5.11)
RDW: 13.9 % (ref 11.5–15.5)
WBC: 5 10*3/uL (ref 4.0–10.5)
nRBC: 0.6 % — ABNORMAL HIGH (ref 0.0–0.2)

## 2018-07-13 LAB — TYPE AND SCREEN
ABO/RH(D): B POS
Antibody Screen: NEGATIVE

## 2018-07-13 LAB — SARS CORONAVIRUS 2 BY RT PCR (HOSPITAL ORDER, PERFORMED IN ~~LOC~~ HOSPITAL LAB): SARS Coronavirus 2: NEGATIVE

## 2018-07-13 LAB — ABO/RH: ABO/RH(D): B POS

## 2018-07-13 MED ORDER — LACTATED RINGERS IV SOLN
500.0000 mL | Freq: Once | INTRAVENOUS | Status: AC
  Administered 2018-07-13: 500 mL via INTRAVENOUS

## 2018-07-13 MED ORDER — PHENYLEPHRINE 40 MCG/ML (10ML) SYRINGE FOR IV PUSH (FOR BLOOD PRESSURE SUPPORT): 80.0000 ug | PREFILLED_SYRINGE | INTRAVENOUS | Status: DC | PRN

## 2018-07-13 MED ORDER — EPHEDRINE 5 MG/ML INJ: 10.0000 mg | INTRAVENOUS | Status: DC | PRN

## 2018-07-13 MED ORDER — MISOPROSTOL 25 MCG QUARTER TABLET
25.0000 ug | ORAL_TABLET | ORAL | Status: DC
  Administered 2018-07-13: 10:00:00 25 ug via BUCCAL

## 2018-07-13 MED ORDER — OXYCODONE-ACETAMINOPHEN 5-325 MG PO TABS: 2.0000 | ORAL_TABLET | ORAL | Status: DC | PRN

## 2018-07-13 MED ORDER — FENTANYL-BUPIVACAINE-NACL 0.5-0.125-0.9 MG/250ML-% EP SOLN
12.0000 mL/h | EPIDURAL | Status: DC | PRN
  Filled 2018-07-13: qty 250

## 2018-07-13 MED ORDER — FENTANYL CITRATE (PF) 100 MCG/2ML IJ SOLN
100.0000 ug | INTRAMUSCULAR | Status: DC | PRN
  Administered 2018-07-13 (×3): 100 ug via INTRAVENOUS
  Filled 2018-07-13 (×2): qty 2

## 2018-07-13 MED ORDER — TERBUTALINE SULFATE 1 MG/ML IJ SOLN: 0.2500 mg | Freq: Once | INTRAMUSCULAR | Status: DC | PRN

## 2018-07-13 MED ORDER — LACTATED RINGERS IV SOLN
INTRAVENOUS | Status: DC
  Administered 2018-07-13 – 2018-07-14 (×4): via INTRAVENOUS

## 2018-07-13 MED ORDER — SOD CITRATE-CITRIC ACID 500-334 MG/5ML PO SOLN: 30.0000 mL | ORAL | Status: DC | PRN

## 2018-07-13 MED ORDER — SODIUM CHLORIDE (PF) 0.9 % IJ SOLN
INTRAMUSCULAR | Status: DC | PRN
  Administered 2018-07-13: 12 mL/h via EPIDURAL

## 2018-07-13 MED ORDER — FENTANYL CITRATE (PF) 100 MCG/2ML IJ SOLN
INTRAMUSCULAR | Status: AC
  Filled 2018-07-13: qty 2

## 2018-07-13 MED ORDER — ONDANSETRON HCL 4 MG/2ML IJ SOLN: 4.0000 mg | Freq: Four times a day (QID) | INTRAMUSCULAR | Status: DC | PRN

## 2018-07-13 MED ORDER — LACTATED RINGERS IV SOLN: 500.0000 mL | INTRAVENOUS | Status: DC | PRN

## 2018-07-13 MED ORDER — DIPHENHYDRAMINE HCL 50 MG/ML IJ SOLN: 12.5000 mg | INTRAMUSCULAR | Status: DC | PRN

## 2018-07-13 MED ORDER — OXYTOCIN 40 UNITS IN NORMAL SALINE INFUSION - SIMPLE MED
1.0000 m[IU]/min | INTRAVENOUS | Status: DC
  Administered 2018-07-13: 15:00:00 2 m[IU]/min via INTRAVENOUS

## 2018-07-13 MED ORDER — LIDOCAINE HCL (PF) 1 % IJ SOLN
INTRAMUSCULAR | Status: DC | PRN
  Administered 2018-07-13: 5 mL via EPIDURAL

## 2018-07-13 MED ORDER — OXYTOCIN BOLUS FROM INFUSION
500.0000 mL | Freq: Once | INTRAVENOUS | Status: AC
  Administered 2018-07-14: 04:00:00 500 mL via INTRAVENOUS

## 2018-07-13 MED ORDER — OXYTOCIN 40 UNITS IN NORMAL SALINE INFUSION - SIMPLE MED: 1.0000 m[IU]/min | INTRAVENOUS | Status: DC

## 2018-07-13 MED ORDER — OXYCODONE-ACETAMINOPHEN 5-325 MG PO TABS: 1.0000 | ORAL_TABLET | ORAL | Status: DC | PRN

## 2018-07-13 MED ORDER — ACETAMINOPHEN 325 MG PO TABS: 650.0000 mg | ORAL_TABLET | ORAL | Status: DC | PRN

## 2018-07-13 MED ORDER — OXYTOCIN 40 UNITS IN NORMAL SALINE INFUSION - SIMPLE MED
2.5000 [IU]/h | INTRAVENOUS | Status: DC
  Filled 2018-07-13: qty 1000

## 2018-07-13 MED ORDER — MISOPROSTOL 25 MCG QUARTER TABLET
ORAL_TABLET | ORAL | Status: AC
  Filled 2018-07-13: qty 1

## 2018-07-13 MED ORDER — LIDOCAINE HCL (PF) 1 % IJ SOLN: 30.0000 mL | INTRAMUSCULAR | Status: DC | PRN

## 2018-07-13 NOTE — Progress Notes (Signed)
Patient Vitals for the past 12 hrs:  BP Temp Temp src Pulse Resp SpO2  07/13/18 2220 - - - - - 100 %  07/13/18 2215 - - - - - 100 %  07/13/18 2210 - - - - - 100 %  07/13/18 2205 - - - - - 100 %  07/13/18 2201 126/70 - - (!) 103 17 -  07/13/18 2200 - - - - - 100 %  07/13/18 2155 - - - - - 100 %  07/13/18 2145 - - - - - 100 %  07/13/18 2140 - - - - - 100 %  07/13/18 2135 - - - - - 100 %  07/13/18 2131 120/62 - - (!) 107 19 -  07/13/18 2130 - - - - - 100 %  07/13/18 2125 - - - - - 100 %  07/13/18 2120 - - - - - 100 %  07/13/18 2115 - - - - - 100 %  07/13/18 2111 - - - - - 100 %  07/13/18 2110 - - - - - 100 %  07/13/18 2105 - - - - - 100 %  07/13/18 2104 114/60 - - 97 18 -  07/13/18 2100 - - - - - 100 %  07/13/18 2055 - - - - - 100 %  07/13/18 2050 - - - - - 100 %  07/13/18 2045 - - - - - 100 %  07/13/18 2040 - - - - - 100 %  07/13/18 2035 - - - - - 100 %  07/13/18 2031 131/60 - - 96 18 -  07/13/18 2030 - - - - - 100 %  07/13/18 2025 - - - - - 100 %  07/13/18 2020 - - - - - 100 %  07/13/18 2016 128/72 - - 82 19 -  07/13/18 2015 - - - - - 100 %  07/13/18 2011 131/74 - - 100 19 -  07/13/18 2010 - - - - - 100 %  07/13/18 2006 124/66 - - 88 18 -  07/13/18 2005 - - - - - 100 %  07/13/18 2002 136/72 - - (!) 106 19 -  07/13/18 2001 136/72 - - (!) 106 19 -  07/13/18 2000 - - - - - 100 %  07/13/18 1956 114/86 - - (!) 141 20 -  07/13/18 1955 - - - - - 100 %  07/13/18 1953 (!) 119/58 - - (!) 117 20 -  07/13/18 1950 - - - - - 100 %  07/13/18 1949 134/60 - - 97 18 -  07/13/18 1948 134/60 - - 97 19 100 %  07/13/18 1946 134/60 - - 97 20 -  07/13/18 1945 - - - - - 100 %  07/13/18 1941 122/64 - - 88 18 -  07/13/18 1940 - - - - - 100 %  07/13/18 1936 129/69 98 F (36.7 C) Oral 87 17 -  07/13/18 1935 - - - - - 100 %  07/13/18 1932 131/63 - - 83 18 -  07/13/18 1930 - - - - - 100 %  07/13/18 1926 (!) 142/79 - - 100 18 -  07/13/18 1925 - - - - - 100 %  07/13/18 1922 138/74 - - 77 19  100 %  07/13/18 1916 (!) 154/92 - - 89 20 -  07/13/18 1900 (!) 141/82 - - 74 20 99 %  07/13/18 1809 134/64 - - (!) 105 18 -  07/13/18 1804 124/78 - - - 20 -  07/13/18 1801 135/90 98.3 F (36.8 C) Oral (!) 101 - -  07/13/18 1748 132/82 - - (!) 105 20 -  07/13/18 1744 - - - 92 - -  07/13/18 1731 123/82 - - 85 20 -  07/13/18 1701 132/79 - - (!) 107 18 -  07/13/18 1631 122/67 - - 92 - -  07/13/18 1601 132/75 - - 93 - -  07/13/18 1543 130/77 - - (!) 124 18 -  07/13/18 1531 138/73 - - 100 18 -  07/13/18 1501 137/89 98.6 F (37 C) Oral (!) 118 - -  07/13/18 1431 122/90 - - 91 16 -  07/13/18 1042 (!) 145/87 98.2 F (36.8 C) Oral 84 18 -    Pt meets criteria for GHTN, will monitor as such. Cx feels complete, but there is an enormous BOW in her vagina (creating very uncomfortable pressure for the pt) with vtx floating up out of the pelvis.  Risk of prolapse is too great for AROM, Dr. Macon Large agrees. FHR Cat 1, pitocin back on at 5 mu/min w/ctx q 1.5-3 minutes.  Pt up in high fowler's w/hope of helping vtx descend.

## 2018-07-13 NOTE — H&P (Signed)
Tiffany Leblanc is a 18 y.o. female G1P0000 with IUP at [redacted]w[redacted]d presenting for IOL for polyhydramnios. Last AFT 27+ 2 weeks ago. AFI from last week not read by MFM yet in chart. [facility]  Prenatal History/Complications:   Polyhydramnios, mild  Past Medical History: Past Medical History:  Diagnosis Date  . Headache   . Pyelonephritis   . Urinary tract infection     Past Surgical History: Past Surgical History:  Procedure Laterality Date  . NO PAST SURGERIES      Obstetrical History: OB History    Gravida  1   Para  0   Term  0   Preterm  0   AB  0   Living  0     SAB  0   TAB  0   Ectopic  0   Multiple  0   Live Births  0        Obstetric Comments  Patient admits to being sexually active when asked in private        Social History: Social History   Socioeconomic History  . Marital status: Single    Spouse name: Not on file  . Number of children: Not on file  . Years of education: Not on file  . Highest education level: Not on file  Occupational History  . Not on file  Social Needs  . Financial resource strain: Not on file  . Food insecurity:    Worry: Never true    Inability: Never true  . Transportation needs:    Medical: No    Non-medical: No  Tobacco Use  . Smoking status: Current Some Day Smoker    Packs/day: 0.25    Types: Cigarettes  . Smokeless tobacco: Never Used  Substance and Sexual Activity  . Alcohol use: Not Currently  . Drug use: Yes    Types: Marijuana  . Sexual activity: Not Currently    Birth control/protection: None  Lifestyle  . Physical activity:    Days per week: Not on file    Minutes per session: Not on file  . Stress: Not on file  Relationships  . Social connections:    Talks on phone: Not on file    Gets together: Not on file    Attends religious service: Not on file    Active member of club or organization: Not on file    Attends meetings of clubs or organizations: Not on file     Relationship status: Not on file  Other Topics Concern  . Not on file  Social History Narrative   ** Merged History Encounter **       ** Data from: 05/07/15 Enc Dept: [facility]   Tiffany Leblanc lives at home with mom 5 siblings, no pets in the home.        ** Data from: 06/08/15 Enc Dept: [facility] PEDIATR[facility]   Lives with Mom and "Godfather". Smokes. Drinks alcohol and uses marijuana.    Family History: Family History  Problem Relation Age of Onset  . Heart murmur Brother   . Hypertension Maternal Grandmother   . Arthritis Maternal Grandfather   . Cancer Paternal Grandmother     Allergies: Allergies  Allergen Reactions  . Coconut Oil     States hives with coconut soap    Medications Prior to Admission  Medication Sig Dispense Refill Last Dose  . Prenatal Multivit-Min-Fe-FA (PRENATAL VITAMINS) 0.8 MG tablet Take 1 tablet by mouth daily. (Patient not taking: Reported  on 05/03/2018) 30 tablet 12 Not Taking        Review of Systems   Constitutional: Negative for fever and chills Eyes: Negative for visual disturbances Respiratory: Negative for shortness of breath, dyspnea Cardiovascular: Negative for chest pain or palpitations  Gastrointestinal: Negative for abdominal pain, vomiting, diarrhea and constipation.   Genitourinary: Negative for dysuria and urgency Musculoskeletal: Negative for back pain, joint pain, myalgias  Neurological: Negative for dizziness and headaches      Height 5\' 2"  (1.575 m), weight 81.6 kg, last menstrual period 10/12/2017. General appearance: alert, cooperative and no distress Lungs: clear to auscultation bilaterally Heart: regular rate and rhythm Abdomen: soft, non-tender; bowel sounds normal Extremities: Homans sign is negative, no sign of DVT DTR's 2+ Presentation: cephalic Fetal monitoring  Baseline: 140 bpm, Variability: Good {> 6 bpm), Accelerations: Reactive and Decelerations: Absent Uterine activity  Lots of UI on EFM, not felt   Dilation: 1.5 Effacement (%): 60 Station: -1 Exam by:: Drenda FreezeFran, CNM   Prenatal labs: ABO, Rh: --/--/B POS Performed at [facility], [address] [place], [place] [address]  [phone](05/23 0927) Antibody: NEG (05/23 0824) Rubella: immune RPR: Non Reactive (03/13 0954)  HBsAg: Negative (12/03 1040)  HIV: Non Reactive (03/13 0954)    Prenatal Transfer Tool  Maternal Diabetes: No Genetic Screening: Normal Maternal Ultrasounds/Referrals: mild polyh Fetal Ultrasounds or other Referrals:  Referred to Materal Fetal Medicine normal US Maternal Substance Abuse:  No Significant Maternal Medications:  None Significant Maternal Lab Results: Lab values include: Group B Strep negative    Results for orders placed or performed during the hospital encounter of 07/13/18 (from the past 24 hour(s))  Type and screen   Collection Time: 07/13/18  8:24 AM  Result Value Ref Range   ABO/RH(D) B POS    Antibody Screen NEG    Sample Expiration      07/16/2018,2359 Performed at [facility], [address] [place], [place] [address]   CBC   Collection Time: 07/13/18  8:30 AM  Result Value Ref Range   WBC 5.0 4.0 - 10.5 K/uL   RBC 3.92 3.87 - 5.11 MIL/uL   Hemoglobin 9.8 (L) 12.0 - 15.0 g/dL   HCT 08.632.2 (L) 57.836.0 - 46.946.0 %   MCV 82.1 80.0 - 100.0 fL   MCH 25.0 (L) 26.0 - 34.0 pg   MCHC 30.4 30.0 - 36.0 g/dL   RDW 62.913.9 52.811.5 - 41.315.5 %   Platelets 275 150 - 400 K/uL   nRBC 0.6 (H) 0.0 - 0.2 %  ABO/Rh   Collection Time: 07/13/18  9:27 AM  Result Value Ref Range   ABO/RH(D)      B POS Performed at [facility], [address] [place], [place] [address]      Nursing Staff Provider  Office Location [facility]  Dating  LMP/19  Language  English Anatomy US  Incomplete, marginal cord insertion  Flu Vaccine  declined Genetic Screen  Quad: negative NIPs: low risk female  TDaP vaccine   05/03/18 Hgb A1C or  GTT  Third trimester: Nml  Rhogam  n/a   LAB RESULTS   Feeding Plan breast  Blood Type   B+  Contraception none Antibody  negative  Circumcision NA Rubella  immune  Pediatrician  List Given 12/20 RPR   negative  Support Person Johnny BridgeMartha (mom)  HBsAg   negative  Prenatal Classes List Given 12/20 HIV  negative  BTL Consent n/a GBS  negative  VBAC Consent n/a Pap  n/a    Hgb Electro   normal    CF  negative    SMA  negative    Waterbirth   Class  Consent  CNM visit   Assessment: Tiffany Leblanc is a 18 y.o. G1P0000 with an IUP at [redacted]w[redacted]d presenting for IOL for polyhydramnios.  Plan: #Labor: PO Cytotec->Foley inserted and inflated w/60cc H20 #Pain:  Per request #FWB Cat 1    Jacklyn Shell 07/13/2018, 11:05 AM

## 2018-07-13 NOTE — Discharge Summary (Signed)
Postpartum Discharge Summary     Patient Name: Tiffany Leblanc DOB: [birth year] MRN: [id number]  Date of admission: 07/13/2018 Delivering Provider: Jaynie CollinsANYANWU, UGONNA A   Date of discharge: 07/16/2018  Admitting diagnosis: pregnancy Intrauterine pregnancy: 7043w1d     Secondary diagnosis:  Principal Problem:   Polyhydramnios affecting pregnancy Active Problems:   Gestational hypertension   NSVD (normal spontaneous vaginal delivery)  Additional problems: polyhydramnios     Discharge diagnosis: Term Pregnancy Delivered                                                                                                Post partum procedures:none  Augmentation: Pitocin, Cytotec and Foley Balloon  Complications: None  Hospital course:  Induction of Labor With Vaginal Delivery   18 y.o. yo G1P0000 at 4451w2d was admitted to the hospital 07/13/2018 for induction of labor.  Indication for induction: polyhydramnios.  Patient had an uncomplicated labor course as follows: Membrane Rupture Time/Date: 3:29 AM ,07/14/2018   Intrapartum Procedures: Episiotomy: None [1] none                                        Lacerations:  Periurethral [8]  Patient had delivery of a Viable infant.  Information for the patient's newborn:  Leitha BleakHerring, Girl Channing Muttersiyah [[id number]][[id number]]  Delivery Method: Vag-Spont   07/14/2018  Details of delivery can be found in separate delivery note.  Patient had a routine postpartum course. Patient is discharged home 07/16/18.  Magnesium Sulfate recieved: No BMZ received: No  Physical exam  Vitals:   07/15/18 0509 07/15/18 1357 07/15/18 2114 07/16/18 0549  BP: 125/85 128/80 125/75 140/79  Pulse: 79 71 78 76  Resp: 15 15 18 15   Temp: 98 F (36.7 C) 98.2 F (36.8 C) 98.4 F (36.9 C) 98 F (36.7 C)  TempSrc: Oral Oral Oral Oral  SpO2:  100%  100%  Weight:      Height:       General: alert, cooperative and no distress Lochia: appropriate Uterine Fundus: firm Incision: N/A DVT  Evaluation: No evidence of DVT seen on physical exam. Labs: Lab Results  Component Value Date   WBC 5.0 07/13/2018   HGB 9.8 (L) 07/13/2018   HCT 32.2 (L) 07/13/2018   MCV 82.1 07/13/2018   PLT 275 07/13/2018   CMP Latest Ref Rng & Units 06/26/2018  Glucose 65 - 99 mg/dL 66  BUN 6 - 20 mg/dL 12  Creatinine 7.820.57 - 9.561.00 mg/dL 2.130.69  Sodium 086134 - 578144 mmol/L 134  Potassium 3.5 - 5.2 mmol/L 4.5  Chloride 96 - 106 mmol/L 104  CO2 22 - 32 mmol/L -  Calcium 8.7 - 10.2 mg/dL 9.6  Total Protein 6.0 - 8.5 g/dL 4.6(N5.8(L)  Total Bilirubin 0.0 - 1.2 mg/dL <6.2<0.2  Alkaline Phos 39 - 117 IU/L 114  AST 0 - 40 IU/L 21  ALT 0 - 44 U/L -    Discharge instruction: per After Visit Summary and "Baby and Me Booklet".  After visit  meds:  Allergies as of 07/16/2018      Reactions   Coconut Oil    States hives with coconut soap      Medication List    TAKE these medications   ibuprofen 600 MG tablet Commonly known as:  ADVIL Take 1 tablet (600 mg total) by mouth every 6 (six) hours.   Prenatal Vitamins 0.8 MG tablet Take 1 tablet by mouth daily.       Diet: No evidence of DVT seen on physical exam.  Activity: Advance as tolerated. Pelvic rest for 6 weeks.   Outpatient follow up:4 weeks, BP check in 1 week Follow up Appt:No future appointments. Follow up Visit: Follow-up Information    [facility] Follow up in 4 week(s).   Specialty:  Obstetrics and Gynecology Why:  For postpartum visit. Contact information: [address], [address] [id number] mc [place] [address] [phone]           Please schedule this patient for Postpartum visit in: 4 weeks with the following provider: Any provider For C/S patients schedule nurse incision check in weeks 2 weeks:  High risk pregnancy complicated by: polyhydramnios and GHTN (dx in labor) Delivery mode:  Vacuum Anticipated Birth Control:  abstinence PP Procedures needed: BP  check  Schedule Integrated BH visit: yes (depression w/FOB breaking up w/her)      Newborn Data: Live born adult  Birth Weight:   APGAR: ,   Newborn Delivery   Birth date/time:   Delivery type:       Baby Feeding: Bottle Disposition:home with mother   07/16/2018 Sharen Counter, CNM

## 2018-07-13 NOTE — Plan of Care (Signed)

## 2018-07-13 NOTE — Progress Notes (Signed)
Vitals:   07/13/18 0817 07/13/18 1042  BP: (!) 146/87 (!) 145/87  Pulse: 78 84  Resp: 18 18  Temp:  98.2 F (36.8 C)   Foley out, feels better.  FHR Cat 1. Ctx vs UI.  Will start pitocin once it's been 4 hours since cytotec (around 2pm) if needed.

## 2018-07-13 NOTE — Progress Notes (Signed)
Vitals:   07/13/18 0817 07/13/18 1042  BP: (!) 146/87 (!) 145/87  Pulse: 78 84  Resp: 18 18  Temp:  98.2 F (36.8 C)   cx 4-5/70/-1/BBOW, vtx confirmed w/US.  FHR Cat 1. UI on EFM.  Will start pitocin

## 2018-07-13 NOTE — Anesthesia Preprocedure Evaluation (Signed)
Anesthesia Evaluation  Patient identified by MRN, date of birth, ID band Patient awake    Reviewed: Allergy & Precautions, NPO status , Patient's Chart, lab work & pertinent test results  Airway Mallampati: II  TM Distance: >3 FB Neck ROM: Full    Dental no notable dental hx. (+) Teeth Intact   Pulmonary Current Smoker,    Pulmonary exam normal breath sounds clear to auscultation       Cardiovascular hypertension, Pt. on medications Normal cardiovascular exam Rhythm:Regular Rate:Normal  Bp 140s/80s   Neuro/Psych  Headaches, negative neurological ROS  negative psych ROS   GI/Hepatic   Endo/Other  negative endocrine ROS  Renal/GU Cr 0.63 Plt 4.5     Musculoskeletal   Abdominal   Peds  Hematology Hgb 9.8 Plt 275   Anesthesia Other Findings   Reproductive/Obstetrics (+) Pregnancy                             Anesthesia Physical Anesthesia Plan  ASA: III  Anesthesia Plan: Epidural   Post-op Pain Management:    Induction:   PONV Risk Score and Plan:   Airway Management Planned:   Additional Equipment:   Intra-op Plan:   Post-operative Plan:   Informed Consent: I have reviewed the patients History and Physical, chart, labs and discussed the procedure including the risks, benefits and alternatives for the proposed anesthesia with the patient or authorized representative who has indicated his/her understanding and acceptance.       Plan Discussed with:   Anesthesia Plan Comments: (G1p0 w ? PIH for LEA)        Anesthesia Quick Evaluation

## 2018-07-13 NOTE — Progress Notes (Signed)
Faculty Practice OB/GYN Attending Note  Subjective:  Called to evaluate patient with FHR deceleration to 60s for 5 minutes.  Returned to baseline of 120s with moderate variability, accelerations and no further deceleration after intrauterine neonatal resuscitation maneuvers and pitocin being turned off.  Noted to have five back to back contractions prior to the deceleration. No LOF or vaginal bleeding. Good FM.  Patient is comfortable with epidural.  Admitted on 07/13/2018 for Induction of labor for Polyhydramnios affecting pregnancy.    Objective:  Blood pressure 128/72, pulse 82, temperature 98.3 F (36.8 C), temperature source Oral, resp. rate 19, height 5\' 2"  (1.575 m), weight 81.6 kg, last menstrual period 10/12/2017, SpO2 100 %. FHT  As above Toco: q3-5 minutes Gen: NAD HENT: Normocephalic, atraumatic Lungs: Normal respiratory effort Heart: Regular rate noted Abdomen: NT, gravid fundus, soft Cervix: Dilation: 7 Effacement (%): 90 Cervical Position: Middle Station: Ballotable, -2 Presentation: Vertex Exam by:: Dr. Macon Large  BBOW, fetal head not engaged Ext: 2+ DTRs, no edema, no cyanosis, negative Homan's sign  Assessment & Plan:  18 y.o. G1P0000 at [redacted]w[redacted]d admitted for IOL for polyhydramnios, with resolved FHR deceleration. FHR tracing now Category 1, will continue to monitor closely Restart pitocin after 30 mins at half the dose (5 mu/min) and titrate 1 x1 Continue to monitor BP, had some elevated SBP in 140s earlier GBS neg Hopeful for vaginal delivery   Jaynie Collins, MD, FACOG Obstetrician & Gynecologist, [facility] for [employer], [facility]

## 2018-07-13 NOTE — Anesthesia Procedure Notes (Signed)
Epidural Patient location during procedure: OB Start time: 07/13/2018 6:59 PM End time: 07/13/2018 7:12 PM  Staffing Anesthesiologist: Trevor Iha, MD Performed: anesthesiologist   Preanesthetic Checklist Completed: patient identified, site marked, surgical consent, pre-op evaluation, timeout performed, IV checked, risks and benefits discussed and monitors and equipment checked  Epidural Patient position: sitting Prep: site prepped and draped and DuraPrep Patient monitoring: continuous pulse ox and blood pressure Approach: midline Location: L3-L4 Injection technique: LOR air  Needle:  Needle type: Tuohy  Needle gauge: 17 G Needle length: 9 cm and 9 Needle insertion depth: 5 cm cm Catheter type: closed end flexible Catheter size: 19 Gauge Catheter at skin depth: 12 cm Test dose: negative  Assessment Events: blood not aspirated, injection not painful, no injection resistance, negative IV test and no paresthesia  Additional Notes Patient identified. Risks/Benefits/Options discussed with patient including but not limited to bleeding, infection, nerve damage, paralysis, failed block, incomplete pain control, headache, blood pressure changes, nausea, vomiting, reactions to medication both or allergic, itching and postpartum back pain. Confirmed with bedside nurse the patient's most recent platelet count. Confirmed with patient that they are not currently taking any anticoagulation, have any bleeding history or any family history of bleeding disorders. Patient expressed understanding and wished to proceed. All questions were answered. Sterile technique was used throughout the entire procedure. Please see nursing notes for vital signs. Test dose was given through epidural needle and negative prior to continuing to dose epidural or start infusion. Warning signs of high block given to the patient including shortness of breath, tingling/numbness in hands, complete motor block, or any  concerning symptoms with instructions to call for help. Patient was given instructions on fall risk and not to get out of bed. All questions and concerns addressed with instructions to call with any issues. 1 Attempt (S) . Patient tolerated procedure well.

## 2018-07-14 ENCOUNTER — Encounter (HOSPITAL_COMMUNITY): Payer: Self-pay

## 2018-07-14 DIAGNOSIS — Z3A39 39 weeks gestation of pregnancy: Secondary | ICD-10-CM

## 2018-07-14 LAB — RPR: RPR Ser Ql: NONREACTIVE

## 2018-07-14 MED ORDER — DIBUCAINE (PERIANAL) 1 % EX OINT: 1.0000 "application " | TOPICAL_OINTMENT | CUTANEOUS | Status: DC | PRN

## 2018-07-14 MED ORDER — FERROUS SULFATE 325 (65 FE) MG PO TABS
325.0000 mg | ORAL_TABLET | Freq: Two times a day (BID) | ORAL | Status: DC
  Administered 2018-07-14 – 2018-07-16 (×6): 325 mg via ORAL
  Filled 2018-07-14 (×6): qty 1

## 2018-07-14 MED ORDER — MEASLES, MUMPS & RUBELLA VAC IJ SOLR: 0.5000 mL | Freq: Once | INTRAMUSCULAR | Status: DC

## 2018-07-14 MED ORDER — WITCH HAZEL-GLYCERIN EX PADS: 1.0000 "application " | MEDICATED_PAD | CUTANEOUS | Status: DC | PRN

## 2018-07-14 MED ORDER — DIPHENHYDRAMINE HCL 25 MG PO CAPS: 25.0000 mg | ORAL_CAPSULE | Freq: Four times a day (QID) | ORAL | Status: DC | PRN

## 2018-07-14 MED ORDER — ONDANSETRON HCL 4 MG/2ML IJ SOLN: 4.0000 mg | INTRAMUSCULAR | Status: DC | PRN

## 2018-07-14 MED ORDER — SIMETHICONE 80 MG PO CHEW: 80.0000 mg | CHEWABLE_TABLET | ORAL | Status: DC | PRN

## 2018-07-14 MED ORDER — DOCUSATE SODIUM 100 MG PO CAPS
100.0000 mg | ORAL_CAPSULE | Freq: Two times a day (BID) | ORAL | Status: DC
  Administered 2018-07-14 – 2018-07-16 (×4): 100 mg via ORAL
  Filled 2018-07-14 (×4): qty 1

## 2018-07-14 MED ORDER — PRENATAL MULTIVITAMIN CH
1.0000 | ORAL_TABLET | Freq: Every day | ORAL | Status: DC
  Administered 2018-07-14 – 2018-07-16 (×3): 1 via ORAL
  Filled 2018-07-14 (×3): qty 1

## 2018-07-14 MED ORDER — IBUPROFEN 600 MG PO TABS
600.0000 mg | ORAL_TABLET | Freq: Four times a day (QID) | ORAL | Status: DC
  Administered 2018-07-14 – 2018-07-16 (×7): 600 mg via ORAL
  Filled 2018-07-14 (×8): qty 1

## 2018-07-14 MED ORDER — ACETAMINOPHEN 325 MG PO TABS
650.0000 mg | ORAL_TABLET | ORAL | Status: DC | PRN
  Administered 2018-07-14: 09:00:00 650 mg via ORAL
  Filled 2018-07-14: qty 2

## 2018-07-14 MED ORDER — ZOLPIDEM TARTRATE 5 MG PO TABS: 5.0000 mg | ORAL_TABLET | Freq: Every evening | ORAL | Status: DC | PRN

## 2018-07-14 MED ORDER — BENZOCAINE-MENTHOL 20-0.5 % EX AERO: 1.0000 "application " | INHALATION_SPRAY | CUTANEOUS | Status: DC | PRN

## 2018-07-14 MED ORDER — FLEET ENEMA 7-19 GM/118ML RE ENEM: 1.0000 | ENEMA | Freq: Every day | RECTAL | Status: DC | PRN

## 2018-07-14 MED ORDER — BISACODYL 10 MG RE SUPP: 10.0000 mg | Freq: Every day | RECTAL | Status: DC | PRN

## 2018-07-14 MED ORDER — METHYLERGONOVINE MALEATE 0.2 MG PO TABS: 0.2000 mg | ORAL_TABLET | ORAL | Status: DC | PRN

## 2018-07-14 MED ORDER — TETANUS-DIPHTH-ACELL PERTUSSIS 5-2.5-18.5 LF-MCG/0.5 IM SUSP: 0.5000 mL | Freq: Once | INTRAMUSCULAR | Status: DC

## 2018-07-14 MED ORDER — ONDANSETRON HCL 4 MG PO TABS: 4.0000 mg | ORAL_TABLET | ORAL | Status: DC | PRN

## 2018-07-14 MED ORDER — METHYLERGONOVINE MALEATE 0.2 MG/ML IJ SOLN: 0.2000 mg | INTRAMUSCULAR | Status: DC | PRN

## 2018-07-14 NOTE — Lactation Note (Signed)
This note was copied from a baby's chart. Lactation Consultation Note  Patient Name: Tiffany Leblanc CVELF'Y Date: 07/14/2018 Reason for consult: Initial assessment;Primapara;Term;Infant < 63lbs P21 18 year old mom.  Baby is 10 hours old.  Baby is sleepy and showing no interest in feeding.  RN has obtained a few mls with hand expression and spoon fed baby.  Baby was offered formula once but only took 2 mls. Discussed with mom the feeding plan.   Instructed to offer breast with cues for 8-12 feedings/24 hours, wake by 4th hour if not showing cues, post pump with DEBP every 3 hours and supplement with expressed breast milk/formula.  Symphony pump set up and initiated.  30 mm flanges used.  Breastfeeding consultation services and support information given to patient.  Encouraged to call for assist/concerns prn.  WIC pump referral faxed to [facility] office.  Maternal Data Has patient been taught Hand Expression?: Yes Does the patient have breastfeeding experience prior to this delivery?: No  Feeding Feeding Type: Breast Fed  LATCH Score Latch: Too sleepy or reluctant, no latch achieved, no sucking elicited.  Audible Swallowing: None  Type of Nipple: Everted at rest and after stimulation  Comfort (Breast/Nipple): Soft / non-tender  Hold (Positioning): Full assist, staff holds infant at breast  LATCH Score: 4  Interventions Interventions: Breast feeding basics reviewed;Assisted with latch;Skin to skin;Breast massage;Hand express;Pre-pump if needed;Breast compression;Expressed milk;Hand pump  Lactation Tools Discussed/Used Tools: Pump WIC Program: Yes Pump Review: Setup, frequency, and cleaning;Milk Storage Initiated by:: LMoulden Date initiated:: 07/14/18   Consult Status Consult Status: Follow-up Date: 07/15/18 Follow-up type: In-patient    Huston Foley 07/14/2018, 2:46 PM

## 2018-07-14 NOTE — Progress Notes (Signed)
No change at all in exam.  BOW still in vagina, head not in pelvis.  Pt comfortable, no pressure.  FHR Cat 1, occ mild variable decel.  Dr. Macon Large called to assess pt.

## 2018-07-14 NOTE — Progress Notes (Signed)
Had prolonged decel over an hour ago, recovered after Pit cut off/posiiton change.  Now Cat 1. Ctx slowed somewhat, will restart pitocin at 1 mu/min,  Now BOW is nearly at introitus with head still floating.  Pt is at least comfortable, so on peanut ball, hopefully laboring down.

## 2018-07-14 NOTE — Anesthesia Postprocedure Evaluation (Signed)
Anesthesia Post Note  Patient: Tiffany Leblanc  Procedure(s) Performed: AN AD HOC LABOR EPIDURAL     Patient location during evaluation: Mother Baby Anesthesia Type: Epidural Level of consciousness: awake and alert Pain management: pain level controlled Vital Signs Assessment: post-procedure vital signs reviewed and stable Respiratory status: spontaneous breathing and nonlabored ventilation Cardiovascular status: blood pressure returned to baseline Postop Assessment: no headache, no backache and able to ambulate Anesthetic complications: no    Last Vitals:  Vitals:   07/14/18 0815 07/14/18 0916  BP: (!) 141/62 139/74  Pulse: (!) 103 99  Resp: 18 18  Temp: 37.2 C 37.2 C  SpO2: 100%     Last Pain:  Vitals:   07/14/18 0917  TempSrc:   PainSc: 0-No pain   Pain Goal:                   Marny Lowenstein

## 2018-07-14 NOTE — Consult Note (Signed)
Neonatology Note:   Attendance at Delivery:    I was asked by Dr. Macon Large to attend this NSVD at term due to decels, light meconium and vacuum assist. The mother is a G1, GBS neg with good prenatal care complicated by mild polyhydramnios (nl MFM [place]), current tobacco use, and h/o THC use.  ROM 1 hours before delivery, fluid thin meconium. Infant not vigorous nor with good spontaneous cry and tone. Cord immediately cut and baby brought to warmer where she was warmed, dried and stimulated. HR >100 with good respirations. Pulse ox placed and appropriate.  Remained slightly hypotonic.  Needed only minimal bulb suctioning. Ap 6/8. SGA appearance. Lungs equal clearing to ausc in DR. To CN to care of Pediatrician.  Dineen Kid Leary Roca, MD

## 2018-07-14 NOTE — Progress Notes (Signed)
Faculty Practice OB/GYN Attending Note  Patient's exam has been unchanged for several hours, fetal head is still ballotable on exam and cervix is fully dilated by big BBOW. Given polyhydramnios and risk of cord prolapse, patient counseled about undergoing a gentle AROM.  Verbal consent obtained, BBOW punctured with a 25 gauge needle. There was a copious amount of lightly stained meconium fluid, head slowly noted to get engaged into pelvis. FSE placed.  Patient was asked to push with contractions, fetal head currently at +2 station.  FHR reassuring with baseline in the 120s, moderate variability, accelerations and some early and variable decelerations. Will continue pushing.   Jaynie Collins, MD, FACOG Obstetrician & Gynecologist, [facility] for [employer], [facility]

## 2018-07-15 NOTE — Lactation Note (Signed)
This note was copied from a baby's chart. Lactation Consultation Note  Patient Name: Tiffany Leblanc UQXAF'H Date: 07/15/2018 Reason for consult: Follow-up assessment;Infant < 6lbs;1st time breastfeeding  1453 - 1506 - I visited Tiffany Leblanc to follow up on her progress with breast feeding her daughter, [name]. Mom was informed during this visit that baby would not be discharged today. Mom appeared disappointed. When I probed a bit further about how she was doing, she indicated that she was unhappy with her experience thus far and ready to go home. She states that one issue was the timeliness and quality of her food delivery. I indicated that I would report this matter to our manager and asked her if there was anything else she needed.   In terms of breast feeding, mom states that has not pumped today. She indicates that she does want to increase her milk volume so she can breast feed. Her DEBP kit was set up in the room. Mom states that she understands how to use it. I encouraged her to pump every three hours for 15-20 minutes (8 times a day), and I explained the relationship between breast stimulation and milk production. The LC yesterday put in a referral to [facility] for follow up.  I also discussed feeding baby on cue and limiting intervals between feedings to 3 hours or less. We discussed when baby was due for another feeding, and I recommended that she feed at least 20 mls/feeding today and to feed additionally if baby showed readiness and hunger cues. I offered to assist her with latching baby when mom was ready.  Mom verbalized understanding.   Maternal Data Formula Feeding for Exclusion: No Does the patient have breastfeeding experience prior to this delivery?: No  Feeding Feeding Type: Bottle Fed - Formula Nipple Type: Dr. Myra Gianotti Preemie(yellow slow flow, NFANT purple)   Interventions Interventions: Breast feeding basics reviewed(feeding volumes and  frequency)  Lactation Tools Discussed/Used WIC Program: Yes Pump Review: Setup, frequency, and cleaning   Consult Status Consult Status: Follow-up Date: 07/16/18 Follow-up type: In-patient    Lenore Manner 07/15/2018, 3:17 PM

## 2018-07-15 NOTE — Progress Notes (Signed)
Post Partum Day 1 Subjective: no complaints, up ad lib, voiding and tolerating PO, small lochia, plans to bottle feed, abstinence  Objective: Blood pressure 125/85, pulse 79, temperature 98 F (36.7 C), temperature source Oral, resp. rate 15, height 5\' 2"  (1.575 m), weight 81.6 kg, last menstrual period 10/12/2017, SpO2 100 %, unknown if currently breastfeeding.   BP has been normal since delivery  Physical Exam:  General: alert, cooperative and no distress Lochia:normal flow Chest: CTAB Heart: RRR no m/r/g Abdomen: +BS, soft, nontender,  Uterine Fundus: firm DVT Evaluation: No evidence of DVT seen on physical exam. Extremities: 1+ edema  Recent Labs    07/13/18 0830  HGB 9.8*  HCT 32.2*    Assessment/Plan: Plan for discharge tomorrow   LOS: 2 days   Jacklyn Shell 07/15/2018, 11:05 AM

## 2018-07-15 NOTE — Clinical Social Work Maternal (Signed)
CLINICAL SOCIAL WORK MATERNAL/CHILD NOTE  Patient Details  Name: Tiffany Leblanc MRN: [id number] Date of Birth: 12-11-2000  Date:  07/15/2018  Clinical Social Worker Initiating Note:  Elijio Miles Date/Time: Initiated:  07/15/18/1242     Child's Name:  Tiffany Leblanc   Biological Parents:  Mother(MOB did not want to provide FOB's information and stated he is not involved)   Need for Interpreter:  None   Reason for Referral:  Behavioral Health Concerns, Current Substance Use/Substance Use During Pregnancy    Address:  [place] [address]    Phone number:  [phone] (home)     Additional phone number:   Household Members/Support Persons (HM/SP):   Household Member/Support Person 1, Household Member/Support Person 2   HM/SP Name Relationship DOB or Age  HM/SP -1 [place]    HM/SP -3        HM/SP -4        HM/SP -5        HM/SP -6        HM/SP -7        HM/SP -8          Natural Supports (not living in the home):  Extended Family, Immediate Family, Parent   Professional Supports:     Employment: Unemployed   Type of Work:     Education:  9 to 11 years   Homebound arranged:    Museum/gallery curator Resources:  Medicaid   Other Resources:  [employer], Physicist, medical    Cultural/Religious Considerations Which May Impact Care:    Strengths:  Home prepared for child , Ability to meet basic needs    Psychotropic Medications:         Pediatrician:       Pediatrician List:   [place]      Pediatrician Fax Number:    Risk Factors/Current Problems:  Mental Health Concerns , Substance Use    Cognitive State:  Alert , Able to Concentrate    Mood/Affect:  Calm , Comfortable , Flat    CSW Assessment: CSW received consult for history of depression and THC use during pregnancy.  CSW met with MOB to offer support  and complete assessment.    MOB sitting up in chair holding infant, when CSW entered the room. CSW introduced self and explained reason for consult to which MOB nodded in understanding. MOB appeared gloomy but when CSW inquired about how MOB was feeling she just said she was tired. MOB reported she hadn't been able to get much sleep since being in the hospital and stated she was waiting on her food. Per MOB, she currently lives with her God father and her uncle in [place]. MOB reported she is not currently employed and that her highest level of education completed was 10th grade. MOB confirmed she receives both [facility] and food stamps and was informed of process to get infant added on to her plans. CSW inquired about MOB's history of depression to which MOB denied having any. Per chart review, MOB had reported some depression during [facility] visit after break up with FOB. CSW provided education regarding the baby blues period vs. perinatal mood disorders, discussed treatment and gave resources for mental health follow up if concerns arise.  CSW recommended self-evaluation during the postpartum time period using  the New Mom Checklist from Postpartum Progress and encouraged MOB to contact a medical professional if symptoms are noted at any time.  MOB denied any current mental health symptoms and denied any SI, HI or DV. MOB reported her whole family was a huge support for her and MOB perked up when asked about her support system.   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 CSW informed MOB of Hospital Drug Policy and explained UDS and CDS were still pending but that a CPS report would be made, if warranted. MOB denied any questions or concerns regarding policy.  MOB reported having most of the essential items for infant once discharged but reported infant would be sleeping in the bed with her once home.  CSW provided review of Sudden Infant Death Syndrome (SIDS) precautions, safe sleeping habits and explained the risks of co-sleeping with infant. MOB expressed understanding and was open to CSW providing Baby Box for infant to sleep in once home. MOB also open and agreeable to being referred for programs such as [facility] and/or [employer] to offer additional support for MOB. CSW to make the above referrals.  MOB denied any further questions, concerns or need for resources from CSW at this time.   CSW Plan/Description:  No Further Intervention Required/No Barriers to Discharge, Sudden Infant Death Syndrome (SIDS) Education, Perinatal Mood and Anxiety Disorder (PMADs) Education, [place], Other Information/Referral to [employer], CSW Will Continue to Monitor Umbilical Cord Tissue Drug Screen Results and Make Report if Alcus Dad [place], [place] 07/15/2018, 1:15 PM

## 2018-07-15 NOTE — Progress Notes (Signed)
Pt demonstrates inadequate interactions and bonding with newborn. Pt has not maintained proper tracking of newborn feedings, stools or wet diapers. Pt has been provided with specialized tools (Dr. Manson Passey bottle) to promote feedings of infant, and refuses to use. Pt keeps infant on pillow in bed next to her, despite education. Pt has been made aware of newborns emesis episodes, and instructed to pay attention to newborn. Lack of concern demonstrated. Will continue to monitor and assess the pt.

## 2018-07-16 MED ORDER — IBUPROFEN 600 MG PO TABS: 600.0000 mg | ORAL_TABLET | Freq: Four times a day (QID) | ORAL | 0 refills | Status: DC

## 2018-07-16 NOTE — Progress Notes (Signed)
CSW acknowledged consult for concerns from RN regarding MOB's engagement with infant. CSW spoke with MOB to offer support and check in. Per MOB, she was able to get some sleep last night and stated she feels better today. MOB appeared to be in better spirits than yesterday and was more engaged with CSW. MOB reported she and infant were doing well and that infant's eating was improving a bit.   CSW went back to provide MOB with Baby Box and to provide review safe sleeping habit education from the day prior, however, Speech Therapist was visiting with MOB. CSW to return back at a later time to provide review.  CSW will continue to follow.  Tiffany Leblanc, LCSWA  [facility] [employer] [phone]

## 2018-07-17 ENCOUNTER — Ambulatory Visit: Payer: Self-pay

## 2018-07-17 NOTE — Lactation Note (Signed)
This note was copied from a baby's chart. Lactation Consultation Note  Patient Name: Tiffany Leblanc EHOZY'Y Date: 07/17/2018 Reason for consult: Follow-up assessment;1st time breastfeeding;Infant < 6lbs;Primapara;Term;Infant weight loss;Other (Comment)(weight is stablizing )  Baby is 77 hours / last serum Bili - 11.3.  LC reviewed doc flow sheets with mom and updated several feedings/ voids and stools.  LC checking on mom to see what she decided about pumping. Per mom forgot to pump since LC saw  Mom last evening but still plans to call [facility] for a DEBP today if baby is D/C. Breast have been leaking,.  LC reviewed engorgement prevention and tx and supply and demand/ storage of breast milk.  Mom aware of the Breastfeeding resources after D/C , active with [place] - WIC.     Maternal Data    Feeding    LATCH Score                   Interventions Interventions: Breast feeding basics reviewed  Lactation Tools Discussed/Used Tools: Pump Breast pump type: Double-Electric Breast Pump WIC Program: Yes Pump Review: Milk Storage   Consult Status Consult Status: Complete Date: 07/17/18    Kathrin Greathouse 07/17/2018, 9:34 AM

## 2018-07-17 NOTE — Lactation Note (Signed)
This note was copied from a baby's chart. Lactation Consultation Note  Patient Name: Tiffany Leblanc Today's Date: 07/17/2018  Late entry from 07/16/2018 [facility] visited mom , as LC entered the room baby in the crib wide awake, grandmother sitting  On the couch and mom in the chair.  Mom feeding preference - breast / formula.  Baby initially was a difficult latch and poor feeder.  Speech pathologist has been involved in the baby's care and baby has  Been feeding with a Dr. Manson Passey Preemie nipple and has gotten up to 49 ml.  See doc flow sheet for range of other feedings.  Per mom has not pumped , but would like to provide breast milk for her baby.  LC reviewed supply and demand and the importance of the 1st 2 weeks pumping  Consistently to establish and protect milk supply at least 8-10 times in 24 hours.  LC asked mom if she needed a review on the DEBP and mom she stated she was all set.  Mom also mentioned WIC - [place] had called her and there is a DEBP available for her tomorrow.  LC will F/U 5/27.      Maternal Data    Feeding    LATCH Score                   Interventions    Lactation Tools Discussed/Used     Consult Status      [name] [name] 07/17/2018, 7:30 AM

## 2018-07-25 ENCOUNTER — Telehealth: Payer: Self-pay | Admitting: Advanced Practice Midwife

## 2018-07-25 NOTE — Telephone Encounter (Signed)
Called patient to get her scheduled with Asher Muir, and her postpartum visit. I left a message for her to call us to give [place] a call with any questions.

## 2018-07-29 NOTE — BH Specialist Note (Signed)
Pt did not arrive to [facility] video visit, and did not answer the phone. Voicemail is full; left MyChart message for patient.   [facility]ated [facility]al [facility] Initial Visit  MRN: [id number] Name: Tiffany Leblanc  Number of Integrated Behavioral HeaJamie C Mikhia Dusek

## 2018-08-01 ENCOUNTER — Ambulatory Visit: Payer: Self-pay | Admitting: Clinical

## 2018-08-01 ENCOUNTER — Other Ambulatory Visit: Payer: Self-pay

## 2018-08-09 ENCOUNTER — Telehealth: Payer: Self-pay | Admitting: Obstetrics & Gynecology

## 2018-08-09 NOTE — Telephone Encounter (Signed)
Attempted to call patient about her appointment, and how she needed to have her MyChart app downloaded in order to have this visit. Voicemail was full.

## 2018-08-12 ENCOUNTER — Other Ambulatory Visit: Payer: Self-pay

## 2018-08-12 ENCOUNTER — Telehealth: Payer: Medicaid Other

## 2018-08-12 ENCOUNTER — Telehealth: Payer: Medicaid Other | Admitting: Advanced Practice Midwife

## 2018-08-12 ENCOUNTER — Encounter: Payer: Self-pay | Admitting: Advanced Practice Midwife

## 2018-08-12 NOTE — Progress Notes (Signed)
Patient did not answer for her visit today.   Marcille Buffy DNP, CNM  08/12/18  2:18 PM

## 2018-08-14 ENCOUNTER — Ambulatory Visit: Payer: Medicaid Other | Admitting: Medical

## 2018-08-19 ENCOUNTER — Telehealth: Payer: Self-pay | Admitting: Licensed Clinical Social Worker

## 2018-08-19 NOTE — Telephone Encounter (Signed)
Left message regarding missed appt.

## 2018-08-26 ENCOUNTER — Telehealth: Payer: Medicaid Other | Admitting: Family

## 2018-08-26 DIAGNOSIS — M546 Pain in thoracic spine: Secondary | ICD-10-CM
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
## 2018-08-27 ENCOUNTER — Encounter: Payer: Self-pay | Admitting: *Deleted

## 2019-08-18 ENCOUNTER — Emergency Department (HOSPITAL_COMMUNITY)
Admission: EM | Admit: 2019-08-18 | Discharge: 2019-08-18 | Disposition: A | Discharge status: Home / Self Care | Payer: Medicaid Other | Attending: Emergency Medicine | Admitting: Emergency Medicine

## 2019-08-18 ENCOUNTER — Encounter (HOSPITAL_COMMUNITY): Payer: Self-pay | Admitting: Emergency Medicine

## 2019-08-18 ENCOUNTER — Other Ambulatory Visit: Payer: Self-pay

## 2019-08-18 DIAGNOSIS — Z5321 Procedure and treatment not carried out due to patient leaving prior to being seen by health care provider: Secondary | ICD-10-CM | POA: Insufficient documentation

## 2019-08-18 DIAGNOSIS — R1032 Left lower quadrant pain: Secondary | ICD-10-CM | POA: Diagnosis present

## 2019-08-18 LAB — COMPREHENSIVE METABOLIC PANEL
ALT: 13 U/L (ref 0–44)
AST: 16 U/L (ref 15–41)
Albumin: 3.2 g/dL — ABNORMAL LOW (ref 3.5–5.0)
Alkaline Phosphatase: 87 U/L (ref 38–126)
Anion gap: 11 (ref 5–15)
BUN: 8 mg/dL (ref 6–20)
CO2: 21 mmol/L — ABNORMAL LOW (ref 22–32)
Calcium: 8.6 mg/dL — ABNORMAL LOW (ref 8.9–10.3)
Chloride: 99 mmol/L (ref 98–111)
Creatinine, Ser: 1.1 mg/dL — ABNORMAL HIGH (ref 0.44–1.00)
GFR calc Af Amer: >60 mL/min (ref >60)
GFR calc non Af Amer: >60 mL/min (ref >60)
Glucose, Bld: 116 mg/dL — ABNORMAL HIGH (ref 70–99)
Potassium: 3.5 mmol/L (ref 3.5–5.1)
Sodium: 131 mmol/L — ABNORMAL LOW (ref 135–145)
Total Bilirubin: 0.8 mg/dL (ref 0.3–1.2)
Total Protein: 7.3 g/dL (ref 6.5–8.1)

## 2019-08-18 LAB — URINALYSIS, ROUTINE W REFLEX MICROSCOPIC
Bilirubin Urine: NEGATIVE
Glucose, UA: NEGATIVE mg/dL
Ketones, ur: 20 mg/dL — AB
Nitrite: POSITIVE — AB
Protein, ur: 100 mg/dL — AB
Specific Gravity, Urine: 1.024 (ref 1.005–1.030)
WBC, UA: >50 WBC/hpf — ABNORMAL HIGH (ref 0–5)
pH: 5 (ref 5.0–8.0)

## 2019-08-18 LAB — CBC
HCT: 37.4 % (ref 36.0–46.0)
Hemoglobin: 11.6 g/dL — ABNORMAL LOW (ref 12.0–15.0)
MCH: 25.9 pg — ABNORMAL LOW (ref 26.0–34.0)
MCHC: 31 g/dL (ref 30.0–36.0)
MCV: 83.5 fL (ref 80.0–100.0)
Platelets: 243 10*3/uL (ref 150–400)
RBC: 4.48 MIL/uL (ref 3.87–5.11)
RDW: 13.6 % (ref 11.5–15.5)
WBC: 12.1 10*3/uL — ABNORMAL HIGH (ref 4.0–10.5)
nRBC: 0 % (ref 0.0–0.2)

## 2019-08-18 LAB — I-STAT BETA HCG BLOOD, ED (MC, WL, AP ONLY): I-stat hCG, quantitative: 25.7 m[IU]/mL — ABNORMAL HIGH (ref <5)

## 2019-08-18 LAB — LIPASE, BLOOD: Lipase: 16 U/L (ref 11–51)

## 2019-08-18 MED ORDER — SODIUM CHLORIDE 0.9% FLUSH: 3.0000 mL | Freq: Once | INTRAVENOUS | Status: DC

## 2019-08-18 NOTE — ED Triage Notes (Signed)
Patient arrives to ED with complaints of LLQ abdominal pain and left sided flank pain that has hurt for the past three days. Patient describes the pain as sharp. Patient denies N/V/D. Denies vaginal bleeding/abnormal discharge.

## 2019-08-18 NOTE — ED Notes (Signed)
Patient stated she was "tired of waiting" and has left.

## 2019-08-19 ENCOUNTER — Other Ambulatory Visit: Payer: Self-pay

## 2019-08-19 ENCOUNTER — Encounter (HOSPITAL_COMMUNITY): Payer: Self-pay

## 2019-08-19 ENCOUNTER — Ambulatory Visit (HOSPITAL_COMMUNITY)
Admission: EM | Admit: 2019-08-19 | Discharge: 2019-08-19 | Disposition: A | Discharge status: Home / Self Care | Payer: Medicaid Other | Attending: Urgent Care | Admitting: Urgent Care

## 2019-08-19 DIAGNOSIS — Z3201 Encounter for pregnancy test, result positive: Secondary | ICD-10-CM | POA: Diagnosis present

## 2019-08-19 DIAGNOSIS — R109 Unspecified abdominal pain: Secondary | ICD-10-CM | POA: Insufficient documentation

## 2019-08-19 DIAGNOSIS — R509 Fever, unspecified: Secondary | ICD-10-CM

## 2019-08-19 DIAGNOSIS — N10 Acute tubulo-interstitial nephritis: Secondary | ICD-10-CM | POA: Diagnosis not present

## 2019-08-19 DIAGNOSIS — Z20822 Contact with and (suspected) exposure to covid-19: Secondary | ICD-10-CM | POA: Insufficient documentation

## 2019-08-19 LAB — SARS CORONAVIRUS 2 (TAT 6-24 HRS): SARS Coronavirus 2: NEGATIVE

## 2019-08-19 LAB — POC URINE PREG, ED: Preg Test, Ur: NEGATIVE

## 2019-08-19 LAB — HCG, SERUM, QUALITATIVE: Preg, Serum: NEGATIVE

## 2019-08-19 MED ORDER — STERILE WATER FOR INJECTION IJ SOLN
INTRAMUSCULAR | Status: AC
  Filled 2019-08-19: qty 10

## 2019-08-19 MED ORDER — ACETAMINOPHEN 325 MG PO TABS
ORAL_TABLET | ORAL | Status: AC
  Filled 2019-08-19: qty 2

## 2019-08-19 MED ORDER — CEFTRIAXONE SODIUM 1 G IJ SOLR
INTRAMUSCULAR | Status: AC
  Filled 2019-08-19: qty 10

## 2019-08-19 MED ORDER — CEPHALEXIN 500 MG PO CAPS: 500.0000 mg | ORAL_CAPSULE | Freq: Two times a day (BID) | ORAL | 0 refills | Status: DC

## 2019-08-19 MED ORDER — CEFTRIAXONE SODIUM 1 G IJ SOLR
1.0000 g | Freq: Once | INTRAMUSCULAR | Status: AC
  Administered 2019-08-19: 1 g via INTRAMUSCULAR

## 2019-08-19 MED ORDER — ACETAMINOPHEN 325 MG PO TABS
650.0000 mg | ORAL_TABLET | Freq: Once | ORAL | Status: AC
  Administered 2019-08-19: 650 mg via ORAL

## 2019-08-19 NOTE — ED Triage Notes (Signed)
Pt presents with LLQ abdominal pain and left sided flank pain X 3 days with headache.
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
## 2019-08-19 NOTE — Discharge Instructions (Addendum)
I recommend you hydrate very well with at least 64 ounces of water daily.  Make sure you start the oral antibiotic for your kidney infection.  We will notify you through MyChart if we need to change this antibiotic based off of your urine culture.  I will also let you know about your pregnancy test result through MyChart.

## 2019-08-20 ENCOUNTER — Telehealth (HOSPITAL_COMMUNITY): Payer: Self-pay | Admitting: Orthopedic Surgery

## 2019-08-20 LAB — CERVICOVAGINAL ANCILLARY ONLY
Bacterial Vaginitis (gardnerella): POSITIVE — AB
Candida Glabrata: NEGATIVE
Candida Vaginitis: NEGATIVE
Chlamydia: NEGATIVE
Comment: NEGATIVE
Comment: NEGATIVE
Comment: NEGATIVE
Comment: NEGATIVE
Comment: NEGATIVE
Comment: NORMAL
Neisseria Gonorrhea: NEGATIVE
Trichomonas: POSITIVE — AB

## 2019-08-20 MED ORDER — METRONIDAZOLE 500 MG PO TABS: 500.0000 mg | ORAL_TABLET | Freq: Two times a day (BID) | ORAL | 0 refills | Status: DC

## 2019-08-21 LAB — URINE CULTURE: Culture: >=100000 — AB

## 2020-03-20 ENCOUNTER — Other Ambulatory Visit: Payer: Self-pay

## 2020-03-20 ENCOUNTER — Encounter (HOSPITAL_COMMUNITY): Payer: Self-pay

## 2020-03-20 ENCOUNTER — Ambulatory Visit (HOSPITAL_COMMUNITY)
Admission: EM | Admit: 2020-03-20 | Discharge: 2020-03-20 | Disposition: A | Discharge status: Home / Self Care | Payer: Medicaid Other | Attending: Internal Medicine | Admitting: Internal Medicine

## 2020-03-20 DIAGNOSIS — R1030 Lower abdominal pain, unspecified: Secondary | ICD-10-CM

## 2020-03-20 DIAGNOSIS — O26899 Other specified pregnancy related conditions, unspecified trimester: Secondary | ICD-10-CM

## 2020-03-20 DIAGNOSIS — O0931 Supervision of pregnancy with insufficient antenatal care, first trimester: Secondary | ICD-10-CM

## 2020-03-20 LAB — POCT URINALYSIS DIPSTICK, ED / UC
Bilirubin Urine: NEGATIVE
Glucose, UA: NEGATIVE mg/dL
Hgb urine dipstick: NEGATIVE
Ketones, ur: NEGATIVE mg/dL
Leukocytes,Ua: NEGATIVE
Nitrite: NEGATIVE
Protein, ur: NEGATIVE mg/dL
Specific Gravity, Urine: >=1.03 (ref 1.005–1.030)
Urobilinogen, UA: 0.2 mg/dL (ref 0.0–1.0)
pH: 7 (ref 5.0–8.0)

## 2020-03-20 LAB — POC URINE PREG, ED: Preg Test, Ur: POSITIVE — AB
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
## 2020-03-20 NOTE — ED Triage Notes (Signed)
Patient took a pregnancy test at home in November and it was positive. Pt states she is slightly nauseous and would like to know how far along she is. Pt has not had any OB or prenatal care at this point due to transportation issues.

## 2020-03-20 NOTE — Discharge Instructions (Addendum)
Head to [facility] ER for further evaluation of abdominal cramping.

## 2020-03-20 NOTE — ED Triage Notes (Signed)
Pt also complains of LLQ and RLQ pain since yesterday as well as moderate cramps.

## 2020-04-18 ENCOUNTER — Inpatient Hospital Stay (HOSPITAL_COMMUNITY)
Admission: AD | Admit: 2020-04-18 | Discharge: 2020-04-18 | Disposition: A | Discharge status: Home / Self Care | Payer: Medicaid Other | Attending: Obstetrics & Gynecology | Admitting: Obstetrics & Gynecology

## 2020-04-18 ENCOUNTER — Other Ambulatory Visit: Payer: Self-pay

## 2020-04-18 ENCOUNTER — Encounter (HOSPITAL_COMMUNITY): Payer: Self-pay | Admitting: Obstetrics & Gynecology

## 2020-04-18 DIAGNOSIS — Z3689 Encounter for other specified antenatal screening: Secondary | ICD-10-CM

## 2020-04-18 DIAGNOSIS — Z3A17 17 weeks gestation of pregnancy: Secondary | ICD-10-CM

## 2020-04-18 DIAGNOSIS — Z3492 Encounter for supervision of normal pregnancy, unspecified, second trimester: Secondary | ICD-10-CM

## 2020-04-18 LAB — DIFFERENTIAL
Abs Immature Granulocytes: 0.01 10*3/uL (ref 0.00–0.07)
Basophils Absolute: 0 10*3/uL (ref 0.0–0.1)
Basophils Relative: 0 %
Eosinophils Absolute: 0.2 10*3/uL (ref 0.0–0.5)
Eosinophils Relative: 4 %
Immature Granulocytes: 0 %
Lymphocytes Relative: 30 %
Lymphs Abs: 1.4 10*3/uL (ref 0.7–4.0)
Monocytes Absolute: 0.5 10*3/uL (ref 0.1–1.0)
Monocytes Relative: 10 %
Neutro Abs: 2.7 10*3/uL (ref 1.7–7.7)
Neutrophils Relative %: 56 %

## 2020-04-18 LAB — CBC
HCT: 34.6 % — ABNORMAL LOW (ref 36.0–46.0)
Hemoglobin: 11.3 g/dL — ABNORMAL LOW (ref 12.0–15.0)
MCH: 28.8 pg (ref 26.0–34.0)
MCHC: 32.7 g/dL (ref 30.0–36.0)
MCV: 88.3 fL (ref 80.0–100.0)
Platelets: 280 10*3/uL (ref 150–400)
RBC: 3.92 MIL/uL (ref 3.87–5.11)
RDW: 13.2 % (ref 11.5–15.5)
WBC: 4.9 10*3/uL (ref 4.0–10.5)
nRBC: 0 % (ref 0.0–0.2)

## 2020-04-18 LAB — URINALYSIS, ROUTINE W REFLEX MICROSCOPIC
Bilirubin Urine: NEGATIVE
Glucose, UA: NEGATIVE mg/dL
Hgb urine dipstick: NEGATIVE
Ketones, ur: NEGATIVE mg/dL
Leukocytes,Ua: NEGATIVE
Nitrite: NEGATIVE
Protein, ur: NEGATIVE mg/dL
Specific Gravity, Urine: 1.008 (ref 1.005–1.030)
pH: 7 (ref 5.0–8.0)

## 2020-04-18 LAB — TYPE AND SCREEN
ABO/RH(D): B POS
Antibody Screen: NEGATIVE

## 2020-04-18 LAB — HEPATITIS B SURFACE ANTIGEN: Hepatitis B Surface Ag: NONREACTIVE

## 2020-04-18 LAB — HIV ANTIBODY (ROUTINE TESTING W REFLEX): HIV Screen 4th Generation wRfx: NONREACTIVE

## 2020-04-18 MED ORDER — PRENATAL 27-0.8 MG PO TABS: 1.0000 | ORAL_TABLET | Freq: Every day | ORAL | 11 refills | Status: DC

## 2020-04-18 NOTE — MAU Note (Signed)
Patient here to try to be seen for her pregnancy.  LMP end of October.  [place] reports she is homeless so hasn't had transportation to [facility].  Denies any VB since finding out she was pregnant.  + FM.  No gushes of fluid or abnormal discharge.

## 2020-04-18 NOTE — MAU Provider Note (Signed)
Event Date/Time   First Provider Initiated Contact with Patient 04/18/20 1144      S Ms. Tiffany Leblanc is a 20 y.o. G2P1001 patient who presents to MAU today to find out how far along she is. She reports an LMP sometime in October. She denies any bleeding or leaking. Denies pain. Reports feeling some fetal movement. .   O BP (!) 101/55 (BP Location: Right Arm)   Pulse 94   Temp 98 F (36.7 C) (Oral)   Resp 16   Wt 64.4 kg   LMP 12/20/2019 (Approximate)   SpO2 100%   BMI 25.97 kg/m  Physical Exam Vitals and nursing note reviewed.  Constitutional:      General: She is not in acute distress.    Appearance: She is well-developed and well-nourished.  HENT:     Head: Normocephalic.  Eyes:     Pupils: Pupils are equal, round, and reactive to light.  Cardiovascular:     Rate and Rhythm: Normal rate and regular rhythm.     Heart sounds: Normal heart sounds.  Pulmonary:     Effort: Pulmonary effort is normal. No respiratory distress.     Breath sounds: Normal breath sounds.  Abdominal:     General: Bowel sounds are normal. There is no distension.     Palpations: Abdomen is soft.     Tenderness: There is no abdominal tenderness.  Skin:    General: Skin is warm and dry.  Neurological:     Mental Status: She is alert and oriented to person, place, and time.  Psychiatric:        Mood and Affect: Mood and affect normal.        Behavior: Behavior normal.        Thought Content: Thought content normal.        Judgment: Judgment normal.    FHT: 152bpm  A Medical screening exam complete 1. Presence of fetal heart sounds in second trimester   2. [redacted] weeks gestation of pregnancy    -FHT present, patient without complaint.  -OB panel obtained and order for outpatient ultrasound placed. Pregnancy verification letter given to patient with list of available practices for prenatal care  P Discharge from MAU in stable condition List of options for follow-up given  Warning signs  for worsening condition that would warrant emergency follow-up discussed Patient may return to MAU as needed   Rolm Bookbinder, [place] 04/18/2020 1:51 PM

## 2020-04-18 NOTE — Discharge Instructions (Signed)
Second Trimester of Pregnancy  The second trimester of pregnancy is from week 13 through week 27. This is months 4 through 6 of pregnancy. The second trimester is often a time when you feel your best. Your body has adjusted to being pregnant, and you begin to feel better physically. During the second trimester:  Morning sickness has lessened or stopped completely.  You may have more energy.  You may have an increase in appetite. The second trimester is also a time when the unborn baby (fetus) is growing rapidly. At the end of the sixth month, the fetus may be up to 12 inches long and weigh about 1 pounds. You will likely begin to feel the baby move (quickening) between 16 and 20 weeks of pregnancy. Body changes during your second trimester Your body continues to go through many changes during your second trimester. The changes vary and generally return to normal after the baby is born. Physical changes  Your weight will continue to increase. You will notice your lower abdomen bulging out.  You may begin to get stretch marks on your hips, abdomen, and breasts.  Your breasts will continue to grow and to become tender.  Dark spots or blotches (chloasma or mask of pregnancy) may develop on your face.  A dark line from your belly button to the pubic area (linea nigra) may appear.  You may have changes in your hair. These can include thickening of your hair, rapid growth, and changes in texture. Some people also have hair loss during or after pregnancy, or hair that feels dry or thin. Health changes  You may develop headaches.  You may have heartburn.  You may develop constipation.  You may develop hemorrhoids or swollen, bulging veins (varicose veins).  Your gums may bleed and may be sensitive to brushing and flossing.  You may urinate more often because the fetus is pressing on your bladder.  You may have back pain. This is caused by: ? Weight gain. ? Pregnancy hormones that  are relaxing the joints in your pelvis. ? A shift in weight and the muscles that support your balance. Follow these instructions at home: Medicines  Follow your health care provider's instructions regarding medicine use. Specific medicines may be either safe or unsafe to take during pregnancy. Do not take any medicines unless approved by your health care provider.  Take a prenatal vitamin that contains at least 600 micrograms (mcg) of folic acid. Eating and drinking  Eat a healthy diet that includes fresh fruits and vegetables, whole grains, good sources of protein such as meat, eggs, or tofu, and low-fat dairy products.  Avoid raw meat and unpasteurized juice, milk, and cheese. These carry germs that can harm you and your baby.  You may need to take these actions to prevent or treat constipation: ? Drink enough fluid to keep your urine pale yellow. ? Eat foods that are high in fiber, such as beans, whole grains, and fresh fruits and vegetables. ? Limit foods that are high in fat and processed sugars, such as fried or sweet foods. Activity  Exercise only as directed by your health care provider. Most people can continue their usual exercise routine during pregnancy. Try to exercise for 30 minutes at least 5 days a week. Stop exercising if you develop contractions in your uterus.  Stop exercising if you develop pain or cramping in the lower abdomen or lower back.  Avoid exercising if it is very hot or humid or if you are at   a high altitude.  Avoid heavy lifting.  If you choose to, you may have sex unless your health care provider tells you not to. Relieving pain and discomfort  Wear a supportive bra to prevent discomfort from breast tenderness.  Take warm sitz baths to soothe any pain or discomfort caused by hemorrhoids. Use hemorrhoid cream if your health care provider approves.  Rest with your legs raised (elevated) if you have leg cramps or low back pain.  If you develop  varicose veins: ? Wear support hose as told by your health care provider. ? Elevate your feet for 15 minutes, 3-4 times a day. ? Limit salt in your diet. Safety  Wear your seat belt at all times when driving or riding in a car.  Talk with your health care provider if someone is verbally or physically abusive to you. Lifestyle  Do not use hot tubs, steam rooms, or saunas.  Do not douche. Do not use tampons or scented sanitary pads.  Avoid cat litter boxes and soil used by cats. These carry germs that can cause birth defects in the baby and possibly loss of the fetus by miscarriage or stillbirth.  Do not use herbal remedies, alcohol, illegal drugs, or medicines that are not approved by your health care provider. Chemicals in these products can harm your baby.  Do not use any products that contain nicotine or tobacco, such as cigarettes, e-cigarettes, and chewing tobacco. If you need help quitting, ask your health care provider. General instructions  During a routine prenatal visit, your health care provider will do a physical exam and other tests. He or she will also discuss your overall health. Keep all follow-up visits. This is important.  Ask your health care provider for a referral to a local prenatal education class.  Ask for help if you have counseling or nutritional needs during pregnancy. Your health care provider can offer advice or refer you to specialists for help with various needs. Where to find more information  American Pregnancy Association: americanpregnancy.org  American College of Obstetricians and Gynecologists: acog.org/en/Womens%20Health/Pregnancy  Office on Women's Health: womenshealth.gov/pregnancy Contact a health care provider if you have:  A headache that does not go away when you take medicine.  Vision changes or you see spots in front of your eyes.  Mild pelvic cramps, pelvic pressure, or nagging pain in the abdominal area.  Persistent nausea,  vomiting, or diarrhea.  A bad-smelling vaginal discharge or foul-smelling urine.  Pain when you urinate.  Sudden or extreme swelling of your face, hands, ankles, feet, or legs.  A fever. Get help right away if you:  Have fluid leaking from your vagina.  Have spotting or bleeding from your vagina.  Have severe abdominal cramping or pain.  Have difficulty breathing.  Have chest pain.  Have fainting spells.  Have not felt your baby move for the time period told by your health care provider.  Have new or increased pain, swelling, or redness in an arm or leg. Summary  The second trimester of pregnancy is from week 13 through week 27 (months 4 through 6).  Do not use herbal remedies, alcohol, illegal drugs, or medicines that are not approved by your health care provider. Chemicals in these products can harm your baby.  Exercise only as directed by your health care provider. Most people can continue their usual exercise routine during pregnancy.  Keep all follow-up visits. This is important. This information is not intended to replace advice given to you by your   health care provider. Make sure you discuss any questions you have with your health care provider. Document Revised: 07/16/2019 Document Reviewed: 05/22/2019 Elsevier Patient Education  2021 [employer] Inc[employer]  Safe Medications in Pregnancy   Acne: Benzoyl Peroxide Salicylic Acid  Backache/Headache: Tylenol: 2 regular strength every 4 hours OR              2 Extra strength every 6 hours  Colds/Coughs/Allergies: Benadryl (alcohol free) 25 mg every 6 hours as needed Breath right strips Claritin Cepacol throat lozenges Chloraseptic throat spray Cold-Eeze- up to three times per day Cough drops, alcohol free Flonase (by prescription only) Guaifenesin Mucinex Robitussin DM (plain only, alcohol free) Saline nasal spray/drops Sudafed (pseudoephedrine) & Actifed ** use only after [redacted] weeks gestation and if you do not  have high blood pressure Tylenol Vicks Vaporub Zinc lozenges Zyrtec   Constipation: Colace Ducolax suppositories Fleet enema Glycerin suppositories Metamucil Milk of magnesia Miralax Senokot Smooth move tea  Diarrhea: Kaopectate Imodium A-D  *NO pepto Bismol  Hemorrhoids: Anusol Anusol HC Preparation H Tucks  Indigestion: Tums Maalox Mylanta Zantac  Pepcid  Insomnia: Benadryl (alcohol free) 25mg  every 6 hours as needed Tylenol PM Unisom, no Gelcaps  Leg Cramps: Tums MagGel  Nausea/Vomiting:  Bonine Dramamine Emetrol Ginger extract Sea bands Meclizine  Nausea medication to take during pregnancy:  Unisom (doxylamine succinate 25 mg tablets) Take one tablet daily at bedtime. If symptoms are not adequately controlled, the dose can be increased to a maximum recommended dose of two tablets daily (1/2 tablet in the morning, 1/2 tablet mid-afternoon and one at bedtime). Vitamin B6 100mg  tablets. Take one tablet twice a day (up to 200 mg per day).  Skin Rashes: Aveeno products Benadryl cream or 25mg  every 6 hours as needed Calamine Lotion 1% cortisone cream  Yeast infection: Gyne-lotrimin 7 Monistat 7   **If taking multiple medications, please check labels to avoid duplicating the same active ingredients **take medication as directed on the label ** Do not exceed 4000 mg of tylenol in 24 hours **Do not take medications that contain aspirin or ibuprofen

## 2020-04-19 LAB — CULTURE, OB URINE: Culture: <10000 — AB

## 2020-04-19 LAB — RPR: RPR Ser Ql: NONREACTIVE

## 2020-04-21 LAB — RUBELLA SCREEN: Rubella: 2.49 index (ref >0.99)

## 2020-05-06 ENCOUNTER — Telehealth (INDEPENDENT_AMBULATORY_CARE_PROVIDER_SITE_OTHER): Payer: Medicaid Other

## 2020-05-06 DIAGNOSIS — Z3492 Encounter for supervision of normal pregnancy, unspecified, second trimester: Secondary | ICD-10-CM

## 2020-05-06 NOTE — Progress Notes (Signed)
3:30p- Called Pt to start My Chart visit, no answer & they do not have VM set up yet. Will retry later.  4:07p- 2nd attempt, no answer.

## 2020-05-10 ENCOUNTER — Encounter: Payer: Medicaid Other | Admitting: Nurse Practitioner

## 2020-05-23 ENCOUNTER — Encounter: Payer: Self-pay | Admitting: Nurse Practitioner

## 2020-05-24 ENCOUNTER — Telehealth: Payer: Self-pay

## 2020-05-24 ENCOUNTER — Encounter: Payer: Medicaid Other | Admitting: Nurse Practitioner

## 2020-06-03 ENCOUNTER — Telehealth (INDEPENDENT_AMBULATORY_CARE_PROVIDER_SITE_OTHER): Payer: Medicaid Other

## 2020-06-03 DIAGNOSIS — Z3492 Encounter for supervision of normal pregnancy, unspecified, second trimester: Secondary | ICD-10-CM

## 2020-06-03 NOTE — Progress Notes (Signed)
Called pt @ [phone] and was unable to leave a voicemail message due to it not being set up.  Will attempt to call in 15 minutes in which if she does not answer we have to request that she reschedules.  \  Called pt @ [phone] and again was unable to leave voicemail due to not being set up.  MyChart message sent to patient to keep her NEW OB appt scheduled on 06/09/20 to start prenatal care.    Tiffany Leblanc  06/03/20

## 2020-06-09 ENCOUNTER — Encounter: Payer: Self-pay | Admitting: Certified Nurse Midwife

## 2020-06-09 ENCOUNTER — Other Ambulatory Visit (HOSPITAL_COMMUNITY)
Admission: RE | Admit: 2020-06-09 | Discharge: 2020-06-09 | Disposition: A | Discharge status: Home / Self Care | Payer: Medicaid Other | Source: Ambulatory Visit | Attending: Nurse Practitioner | Admitting: Nurse Practitioner

## 2020-06-09 ENCOUNTER — Ambulatory Visit (INDEPENDENT_AMBULATORY_CARE_PROVIDER_SITE_OTHER): Payer: Medicaid Other | Admitting: Certified Nurse Midwife

## 2020-06-09 VITALS — BP 109/72 | HR 102 | Wt 145.0 lb

## 2020-06-09 DIAGNOSIS — Z3492 Encounter for supervision of normal pregnancy, unspecified, second trimester: Secondary | ICD-10-CM | POA: Diagnosis present

## 2020-06-09 DIAGNOSIS — Z3A24 24 weeks gestation of pregnancy: Secondary | ICD-10-CM

## 2020-06-09 DIAGNOSIS — O0990 Supervision of high risk pregnancy, unspecified, unspecified trimester: Secondary | ICD-10-CM | POA: Insufficient documentation

## 2020-06-09 LAB — POCT URINALYSIS DIP (DEVICE)
Bilirubin Urine: NEGATIVE
Glucose, UA: NEGATIVE mg/dL
Hgb urine dipstick: NEGATIVE
Ketones, ur: NEGATIVE mg/dL
Leukocytes,Ua: NEGATIVE
Nitrite: NEGATIVE
Protein, ur: NEGATIVE mg/dL
Specific Gravity, Urine: 1.02 (ref 1.005–1.030)
Urobilinogen, UA: 0.2 mg/dL (ref 0.0–1.0)
pH: 7 (ref 5.0–8.0)

## 2020-06-09 MED ORDER — BLOOD PRESSURE KIT DEVI: 1.0000 | 0 refills | Status: DC | PRN

## 2020-06-09 NOTE — Patient Instructions (Signed)
Second Trimester of Pregnancy  The second trimester of pregnancy is from week 13 through week 27. This is also called months 4 through 6 of pregnancy. This is often the time when you feel your best. During the second trimester:  Morning sickness is less or has stopped.  You may have more energy.  You may feel hungry more often. At this time, your unborn baby (fetus) is growing very fast. At the end of the sixth month, the unborn baby may be up to 12 inches long and weigh about 1 pounds. You will likely start to feel the baby move between 16 and 20 weeks of pregnancy. Body changes during your second trimester Your body continues to go through many changes during this time. The changes vary and generally return to normal after the baby is born. Physical changes  You will gain more weight.  You may start to get stretch marks on your hips, belly (abdomen), and breasts.  Your breasts will grow and may hurt.  Dark spots or blotches may develop on your face.  A dark line from your belly button to the pubic area (linea nigra) may appear.  You may have changes in your hair. Health changes  You may have headaches.  You may have heartburn.  You may have trouble pooping (constipation).  You may have hemorrhoids or swollen, bulging veins (varicose veins).  Your gums may bleed.  You may pee (urinate) more often.  You may have back pain. Follow these instructions at home: Medicines  Take over-the-counter and prescription medicines only as told by your doctor. Some medicines are not safe during pregnancy.  Take a prenatal vitamin that contains at least 600 micrograms (mcg) of folic acid. Eating and drinking  Eat healthy meals that include: ? Fresh fruits and vegetables. ? Whole grains. ? Good sources of protein, such as meat, eggs, or tofu. ? Low-fat dairy products.  Avoid raw meat and unpasteurized juice, milk, and cheese.  You may need to take these actions to prevent or  treat trouble pooping: ? Drink enough fluids to keep your pee (urine) pale yellow. ? Eat foods that are high in fiber. These include beans, whole grains, and fresh fruits and vegetables. ? Limit foods that are high in fat and sugar. These include fried or sweet foods. Activity  Exercise only as told by your doctor. Most people can do their usual exercise during pregnancy. Try to exercise for 30 minutes at least 5 days a week.  Stop exercising if you have pain or cramps in your belly or lower back.  Do not exercise if it is too hot or too humid, or if you are in a place of great height (high altitude).  Avoid heavy lifting.  If you choose to, you may have sex unless your doctor tells you not to. Relieving pain and discomfort  Wear a good support bra if your breasts are sore.  Take warm water baths (sitz baths) to soothe pain or discomfort caused by hemorrhoids. Use hemorrhoid cream if your doctor approves.  Rest with your legs raised (elevated) if you have leg cramps or low back pain.  If you develop bulging veins in your legs: ? Wear support hose as told by your doctor. ? Raise your feet for 15 minutes, 3-4 times a day. ? Limit salt in your food. Safety  Wear your seat belt at all times when you are in a car.  Talk with your doctor if someone is hurting you or yelling  at you a lot. Lifestyle  Do not use hot tubs, steam rooms, or saunas.  Do not douche. Do not use tampons or scented sanitary pads.  Avoid cat litter boxes and soil used by cats. These carry germs that can harm your baby and can cause a loss of your baby by miscarriage or stillbirth.  Do not use herbal medicines, illegal drugs, or medicines that are not approved by your doctor. Do not drink alcohol.  Do not smoke or use any products that contain nicotine or tobacco. If you need help quitting, ask your doctor. General instructions  Keep all follow-up visits. This is important.  Ask your doctor about local  prenatal classes.  Ask your doctor about the right foods to eat or for help finding a counselor. Where to find more information  American Pregnancy Association: americanpregnancy.org  [employer]: www.acog.org  Office on [employer]: KeywordPortfolios.com.br Contact a doctor if:  You have a headache that does not go away when you take medicine.  You have changes in how you see, or you see spots in front of your eyes.  You have mild cramps, pressure, or pain in your lower belly.  You continue to feel like you may vomit (nauseous), you vomit, or you have watery poop (diarrhea).  You have bad-smelling fluid coming from your vagina.  You have pain when you pee or your pee smells bad.  You have very bad swelling of your face, hands, ankles, feet, or legs.  You have a fever. Get help right away if:  You are leaking fluid from your vagina.  You have spotting or bleeding from your vagina.  You have very bad belly cramping or pain.  You have trouble breathing.  You have chest pain.  You faint.  You have not felt your baby move for the time period told by your doctor.  You have new or increased pain, swelling, or redness in an arm or leg. Summary  The second trimester of pregnancy is from week 13 through week 27 (months 4 through 6).  Eat healthy meals.  Exercise as told by your doctor. Most people can do their usual exercise during pregnancy.  Do not use herbal medicines, illegal drugs, or medicines that are not approved by your doctor. Do not drink alcohol.  Call your doctor if you get sick or if you notice anything unusual about your pregnancy. This information is not intended to replace advice given to you by your health care provider. Make sure you discuss any questions you have with your health care provider. Document Revised: 07/16/2019 Document Reviewed: 05/22/2019 Elsevier Patient Education  2021 [employer].   Glucose Tolerance Test Why am I having this test? The glucose tolerance test (GTT) is done to check how your body processes sugar (glucose). This is one of several tests used to diagnose diabetes (diabetes mellitus). Your health care provider may recommend this test if you:  Have a family history of diabetes.  Are obese.  Have infections that keep coming back.  Have had a lot of wounds that did not heal quickly, especially on your legs and feet.  Are a woman and have a history of giving birth to very large babies or a history of repeated fetal loss (stillbirth).  Have had high glucose levels in your urine or blood: ? During a past pregnancy. ? After a heart attack, surgery, or prolonged periods of high stress. What is being tested? This test measures the amount  of glucose in your blood at different times during a period of 2 hours. This indicates how well your body is able to process glucose. What kind of sample is taken? Blood samples are required for this test. They are usually collected by inserting a needle into a blood vessel.   How do I prepare for this test?  For 3 days before your test, eat normally. Have plenty of carbohydrate-rich foods.  Follow instructions from your health care provider about: ? Eating or drinking restrictions on the day of the test. You may be asked to not eat or drink anything other than water (fast) starting 8-12 hours before the test. ? Changing or stopping your regular medicines. Some medicines may interfere with this test. Tell a health care provider about:  All medicines you are taking, including vitamins, herbs, eye drops, creams, and over-the-counter medicines.  Any blood disorders you have.  Any surgeries you have had.  Any medical conditions you have.  Whether you are pregnant or may be pregnant. What happens during the test? First, your blood glucose will be measured. This is referred to as your fasting blood glucose, since  you fasted before the test. Then, you will drink a glucose solution that contains a specific amount of glucose. Your blood glucose will be measured again 1 and 2 hours after drinking the solution. This test takes 2 hours to complete. You will need to stay at the testing location during this time. During the testing period:  Do not eat or drink anything other than the glucose solution. You will be allowed to drink water.  Do not exercise.  Do not use any products that contain nicotine or tobacco. These products include cigarettes, chewing tobacco, and vaping devices, such as e-cigarettes. If you need help quitting, ask your health care provider. The testing procedure may vary among health care providers and hospitals. How are the results reported? Your test results will be reported as values. These will be given as milligrams of glucose per deciliter of blood (mg/dL) or millimoles per liter (mmol/L). Your health care provider will compare your results to normal ranges that were established after testing a large group of people (reference ranges). Reference ranges may vary among labs and hospitals. For this test, common reference ranges are:  Fasting: less than 110 mg/dL (6.1 mmol/L).  1 hour after drinking glucose: less than 180 mg/dL (10.0 mmol/L).  2 hours after drinking glucose: less than 140 mg/dL (7.8 mmol/L). What do the results mean? Results that are within the reference ranges are considered normal, meaning that your glucose levels are well controlled. Results higher than the reference ranges may mean that you recently experienced stress, such as from an injury or a sudden (acute) condition like a heart attack or stroke, or that you have:  Diabetes.  Cushing syndrome.  Tumors such as pheochromocytoma or glucagonoma.  Kidney failure.  Pancreatitis.  Hyperthyroidism.  An infection. Talk with your health care provider about what your results mean. Questions to ask your health  care provider Ask your health care provider, or the department that is doing the test:  When will my results be ready?  How will I get my results?  What are my treatment options?  What other tests do I need?  What are my next steps? Summary  The GTT is done to check how your body processes glucose. This is one of several tests used to diagnose diabetes.  This test measures the amount of glucose in your blood   at different times during a period of 2 hours. This indicates how well your body is able to process glucose.  Talk with your health care provider about what your results mean. This information is not intended to replace advice given to you by your health care provider. Make sure you discuss any questions you have with your health care provider. Document Revised: 11/05/2019 Document Reviewed: 11/05/2019 Elsevier Patient Education  2021 [employer] Inc.  
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
## 2020-06-10 ENCOUNTER — Encounter: Payer: Self-pay | Admitting: *Deleted

## 2020-06-10 LAB — GC/CHLAMYDIA PROBE AMP (~~LOC~~) NOT AT ARMC
Chlamydia: NEGATIVE
Comment: NEGATIVE
Comment: NORMAL
Neisseria Gonorrhea: NEGATIVE

## 2020-06-11 LAB — CULTURE, OB URINE

## 2020-06-11 LAB — URINE CULTURE, OB REFLEX

## 2020-07-01 ENCOUNTER — Other Ambulatory Visit: Payer: Medicaid Other

## 2020-07-01 ENCOUNTER — Other Ambulatory Visit: Payer: Self-pay | Admitting: *Deleted

## 2020-07-01 DIAGNOSIS — O0990 Supervision of high risk pregnancy, unspecified, unspecified trimester: Secondary | ICD-10-CM

## 2020-07-01 NOTE — Addendum Note (Signed)
Addended by: Jill Side on: 07/01/2020 08:31 AM   Modules accepted: Orders

## 2020-07-05 ENCOUNTER — Other Ambulatory Visit: Payer: Self-pay | Admitting: Certified Nurse Midwife

## 2020-07-05 ENCOUNTER — Ambulatory Visit: Payer: Medicaid Other | Attending: Certified Nurse Midwife

## 2020-07-05 ENCOUNTER — Encounter: Payer: Self-pay | Admitting: *Deleted

## 2020-07-05 ENCOUNTER — Ambulatory Visit: Payer: Medicaid Other | Admitting: *Deleted

## 2020-07-05 ENCOUNTER — Other Ambulatory Visit: Payer: Self-pay

## 2020-07-05 DIAGNOSIS — O0990 Supervision of high risk pregnancy, unspecified, unspecified trimester: Secondary | ICD-10-CM | POA: Diagnosis present

## 2020-07-05 DIAGNOSIS — Z3492 Encounter for supervision of normal pregnancy, unspecified, second trimester: Secondary | ICD-10-CM | POA: Diagnosis not present

## 2020-07-05 NOTE — Progress Notes (Signed)
Not in lobby when called 

## 2020-07-06 ENCOUNTER — Other Ambulatory Visit: Payer: Self-pay | Admitting: *Deleted

## 2020-07-06 DIAGNOSIS — O133 Gestational [pregnancy-induced] hypertension without significant proteinuria, third trimester: Secondary | ICD-10-CM

## 2020-07-08 ENCOUNTER — Encounter: Payer: Medicaid Other | Admitting: Student

## 2020-07-08 DIAGNOSIS — O0990 Supervision of high risk pregnancy, unspecified, unspecified trimester: Secondary | ICD-10-CM

## 2020-07-16 NOTE — Telephone Encounter (Signed)
error 

## 2020-08-02 ENCOUNTER — Ambulatory Visit: Payer: Self-pay

## 2020-08-02 ENCOUNTER — Ambulatory Visit: Payer: Medicaid Other

## 2020-08-03 IMAGING — US US FETAL BPP W/NONSTRESS
1 series · 13 of 13 positions shown · non-contrast
Comparison: none

[Series 1: us fetal bpp w/nonstress · 13 acquisitions, 13 frames shown]
[im 1/13]
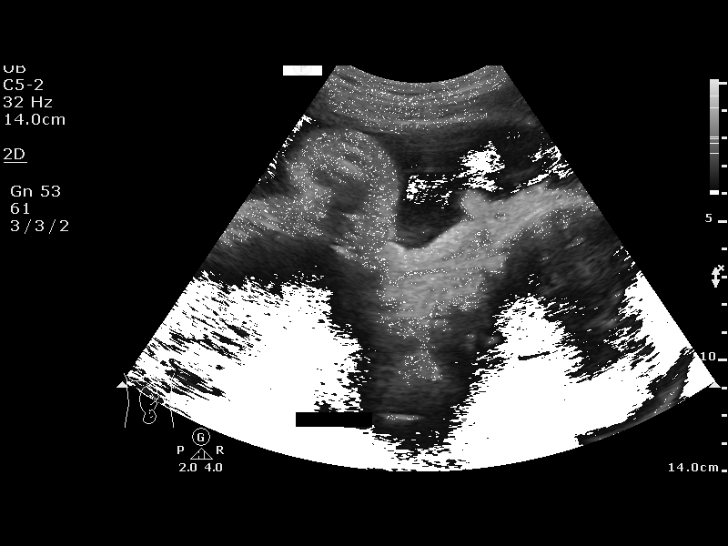
[im 2/13]
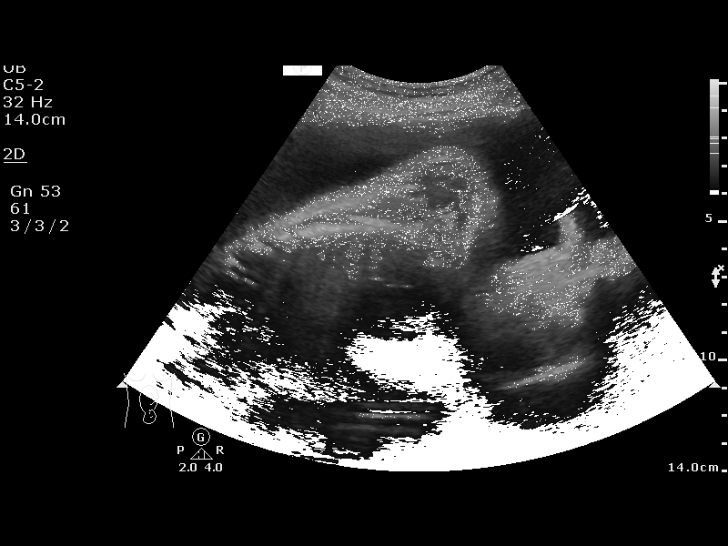
[im 3/13]
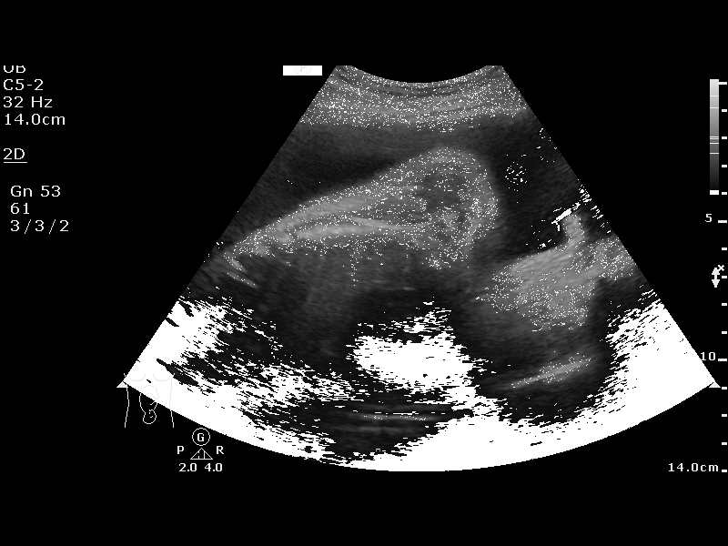
[im 4/13]
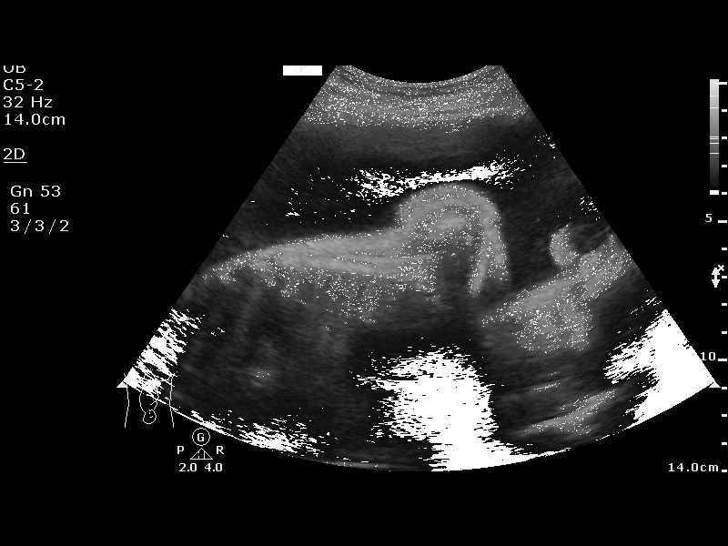
[im 5/13]
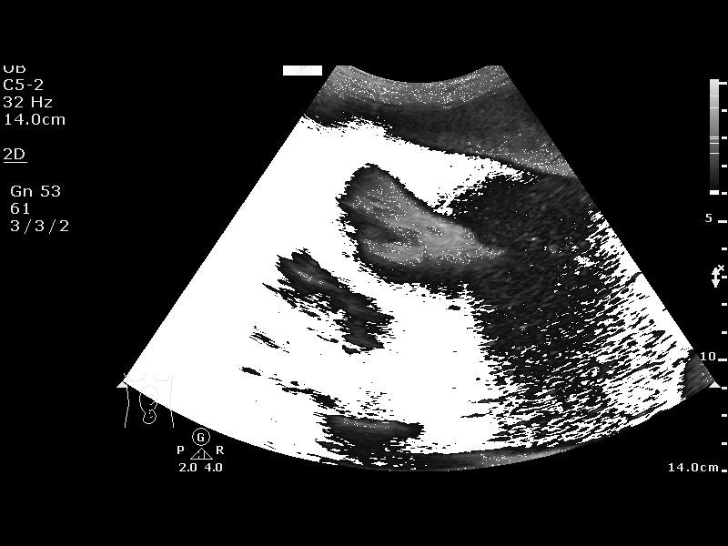
[im 6/13]
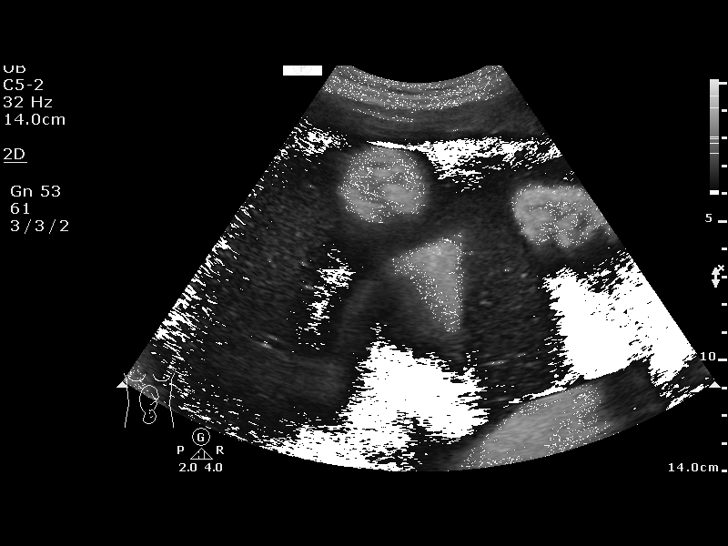
[im 7/13]
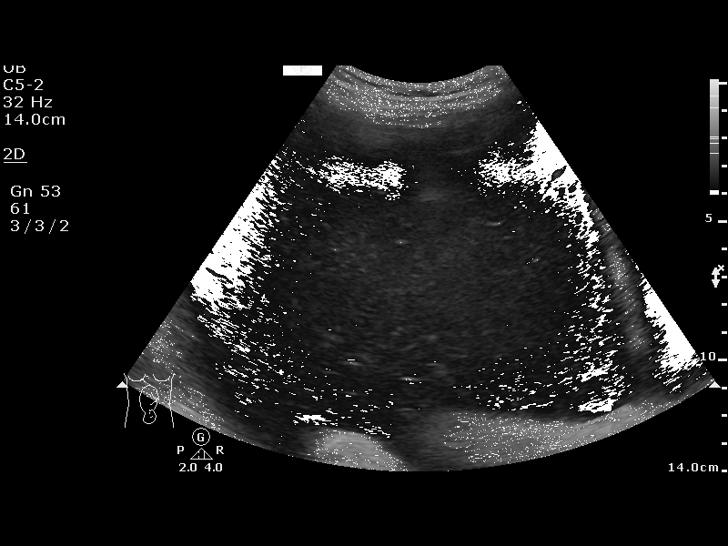
[im 8/13]
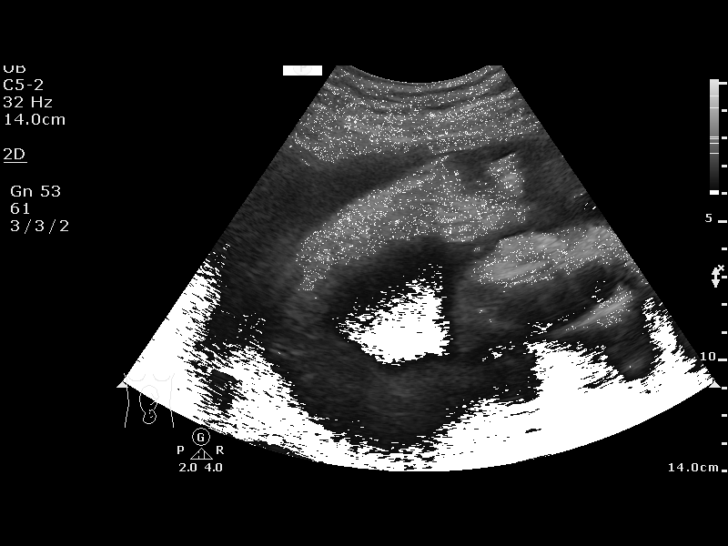
[im 9/13]
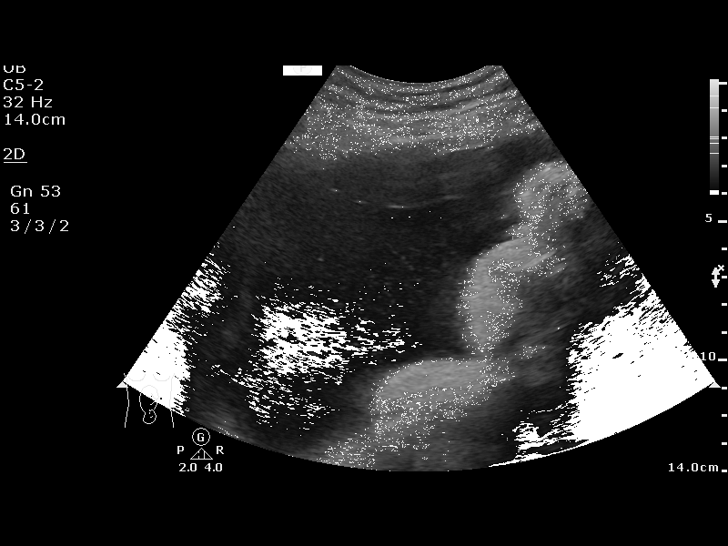
[im 10/13]
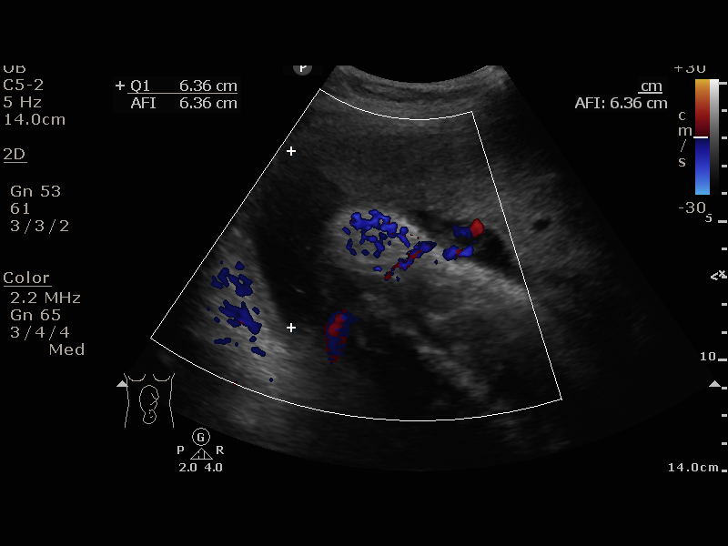
[im 11/13]
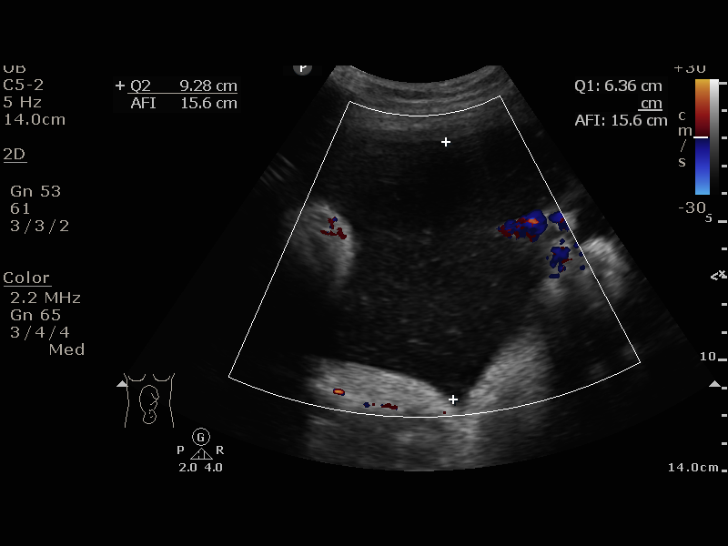
[im 12/13]
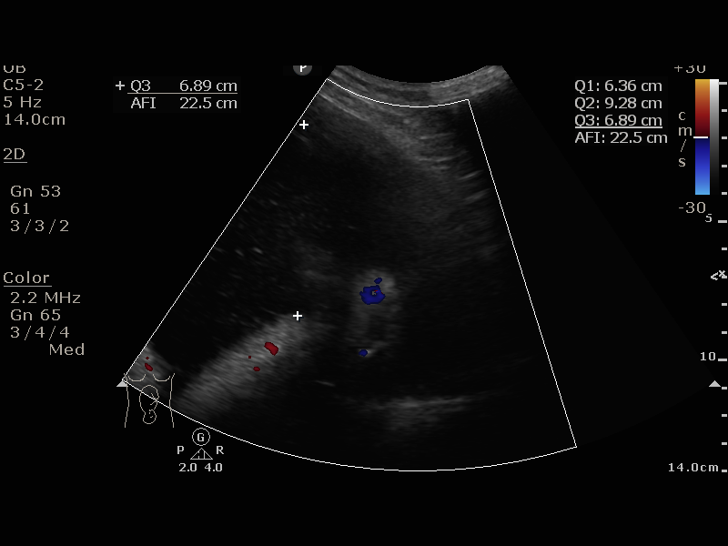
[im 13/13]
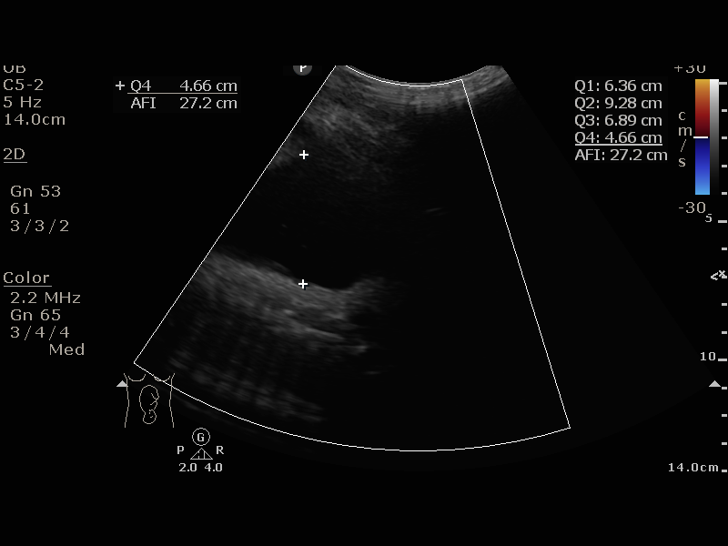

[13 of 13 positions shown; findings below may reference images not displayed]

Name:       AHUTTEY PATOS KOKKO                 Visit Date: 07/04/2018 [DATE]

                                                            [address]
                                                            [facility]
                                                            [REDACTED]
                   [address]

  1  US FETAL BPP W/NONSTRESS             [id number]      UGO ARRUE
 ----------------------------------------------------------------------

 ----------------------------------------------------------------------
Indications

  37 weeks gestation of pregnancy
  Polyhydramnios, third trimester, antepartum
  condition or complication, fetus 1
 ----------------------------------------------------------------------
Vital Signs

                                                Height:        5'2"
Fetal Evaluation

 Num Of Fetuses:         1
 Preg. Location:         Intrauterine
 Cardiac Activity:       Observed
 Fetal Lie:              Maternal right side
 Presentation:           Cephalic

 Amniotic Fluid
 AFI FV:      Polyhydramnios

 AFI Sum(cm)     %Tile       Largest Pocket(cm)
 27.19           > 97

 RUQ(cm)       RLQ(cm)       LUQ(cm)        LLQ(cm)

Biophysical Evaluation

 Amniotic F.V:   Polyhydramnios             F. Tone:        Observed
 F. Movement:    Observed                   N.S.T:          Reactive
 F. Breathing:   Observed                   Score:          [DATE]
OB History

 Gravidity:    1
Gestational Age

 LMP:           37w 6d        Date:  10/12/17                 EDD:   07/19/18
 Best:          37w 6d     Det. By:  LMP  (10/12/17)          EDD:   07/19/18
Impression

 Viable fetus in cephalic position
 BPP [DATE] polyhydramnios
Recommendations

 -Continue weekly BPP.
 induction of labor at 39 weeks
                Fultz, Jamile

## 2020-08-17 ENCOUNTER — Ambulatory Visit: Payer: Medicaid Other | Attending: Obstetrics and Gynecology | Admitting: *Deleted

## 2020-08-17 ENCOUNTER — Ambulatory Visit (HOSPITAL_BASED_OUTPATIENT_CLINIC_OR_DEPARTMENT_OTHER): Payer: Medicaid Other

## 2020-08-17 ENCOUNTER — Other Ambulatory Visit: Payer: Self-pay

## 2020-08-17 ENCOUNTER — Encounter: Payer: Self-pay | Admitting: *Deleted

## 2020-08-17 VITALS — BP 99/65 | HR 115

## 2020-08-17 DIAGNOSIS — O133 Gestational [pregnancy-induced] hypertension without significant proteinuria, third trimester: Secondary | ICD-10-CM

## 2020-08-17 DIAGNOSIS — F17210 Nicotine dependence, cigarettes, uncomplicated: Secondary | ICD-10-CM

## 2020-08-17 DIAGNOSIS — O99333 Smoking (tobacco) complicating pregnancy, third trimester: Secondary | ICD-10-CM

## 2020-08-17 DIAGNOSIS — O0933 Supervision of pregnancy with insufficient antenatal care, third trimester: Secondary | ICD-10-CM | POA: Insufficient documentation

## 2020-08-17 DIAGNOSIS — Z362 Encounter for other antenatal screening follow-up: Secondary | ICD-10-CM | POA: Diagnosis not present

## 2020-08-17 DIAGNOSIS — Z3687 Encounter for antenatal screening for uncertain dates: Secondary | ICD-10-CM | POA: Insufficient documentation

## 2020-08-17 DIAGNOSIS — Z8759 Personal history of other complications of pregnancy, childbirth and the puerperium: Secondary | ICD-10-CM

## 2020-08-17 DIAGNOSIS — O352XX0 Maternal care for (suspected) hereditary disease in fetus, not applicable or unspecified: Secondary | ICD-10-CM | POA: Diagnosis not present

## 2020-08-17 DIAGNOSIS — Z3A37 37 weeks gestation of pregnancy: Secondary | ICD-10-CM | POA: Insufficient documentation

## 2020-08-17 DIAGNOSIS — F17200 Nicotine dependence, unspecified, uncomplicated: Secondary | ICD-10-CM | POA: Insufficient documentation

## 2020-08-17 DIAGNOSIS — O0990 Supervision of high risk pregnancy, unspecified, unspecified trimester: Secondary | ICD-10-CM

## 2020-08-17 NOTE — Progress Notes (Signed)
Patient advised to stop downstairs at [facility] to make appt for prenatal visit as she has not had an appt since April. Labs have not been drawn. Patient stated that she would do so.

## 2020-08-24 ENCOUNTER — Encounter: Payer: Medicaid Other | Admitting: Certified Nurse Midwife

## 2020-09-04 ENCOUNTER — Inpatient Hospital Stay (HOSPITAL_COMMUNITY)
Admission: AD | Admit: 2020-09-04 | Discharge: 2020-09-06 | DRG: 787 | Disposition: A | Discharge status: Home / Self Care | Payer: Medicaid Other | Attending: Obstetrics & Gynecology | Admitting: Obstetrics & Gynecology

## 2020-09-04 ENCOUNTER — Encounter (HOSPITAL_COMMUNITY)
Admission: AD | Disposition: A | Discharge status: Home / Self Care | Payer: Self-pay | Source: Home / Self Care | Attending: Obstetrics & Gynecology

## 2020-09-04 ENCOUNTER — Encounter (HOSPITAL_COMMUNITY): Payer: Self-pay | Admitting: Obstetrics and Gynecology

## 2020-09-04 ENCOUNTER — Inpatient Hospital Stay (HOSPITAL_COMMUNITY): Payer: Medicaid Other | Admitting: Anesthesiology

## 2020-09-04 ENCOUNTER — Other Ambulatory Visit: Payer: Self-pay

## 2020-09-04 DIAGNOSIS — Z20822 Contact with and (suspected) exposure to covid-19: Secondary | ICD-10-CM | POA: Diagnosis present

## 2020-09-04 DIAGNOSIS — Z8759 Personal history of other complications of pregnancy, childbirth and the puerperium: Secondary | ICD-10-CM

## 2020-09-04 DIAGNOSIS — O99320 Drug use complicating pregnancy, unspecified trimester: Secondary | ICD-10-CM | POA: Diagnosis present

## 2020-09-04 DIAGNOSIS — O99334 Smoking (tobacco) complicating childbirth: Secondary | ICD-10-CM | POA: Diagnosis not present

## 2020-09-04 DIAGNOSIS — Z3A39 39 weeks gestation of pregnancy: Secondary | ICD-10-CM | POA: Diagnosis not present

## 2020-09-04 DIAGNOSIS — O321XX0 Maternal care for breech presentation, not applicable or unspecified: Secondary | ICD-10-CM | POA: Diagnosis not present

## 2020-09-04 DIAGNOSIS — F17210 Nicotine dependence, cigarettes, uncomplicated: Secondary | ICD-10-CM | POA: Diagnosis not present

## 2020-09-04 DIAGNOSIS — F1290 Cannabis use, unspecified, uncomplicated: Secondary | ICD-10-CM | POA: Diagnosis not present

## 2020-09-04 DIAGNOSIS — F32A Depression, unspecified: Secondary | ICD-10-CM | POA: Diagnosis present

## 2020-09-04 DIAGNOSIS — O99324 Drug use complicating childbirth: Secondary | ICD-10-CM | POA: Diagnosis not present

## 2020-09-04 DIAGNOSIS — O26893 Other specified pregnancy related conditions, third trimester: Secondary | ICD-10-CM | POA: Diagnosis present

## 2020-09-04 DIAGNOSIS — Z5941 Food insecurity: Secondary | ICD-10-CM | POA: Diagnosis present

## 2020-09-04 DIAGNOSIS — O0990 Supervision of high risk pregnancy, unspecified, unspecified trimester: Secondary | ICD-10-CM

## 2020-09-04 LAB — RAPID URINE DRUG SCREEN, HOSP PERFORMED
Amphetamines: NOT DETECTED
Barbiturates: NOT DETECTED
Benzodiazepines: NOT DETECTED
Cocaine: NOT DETECTED
Opiates: NOT DETECTED
Tetrahydrocannabinol: POSITIVE — AB

## 2020-09-04 LAB — RPR: RPR Ser Ql: NONREACTIVE

## 2020-09-04 LAB — RESP PANEL BY RT-PCR (FLU A&B, COVID) ARPGX2
Influenza A by PCR: NEGATIVE
Influenza B by PCR: NEGATIVE
SARS Coronavirus 2 by RT PCR: NEGATIVE

## 2020-09-04 LAB — CBC
HCT: 32.4 % — ABNORMAL LOW (ref 36.0–46.0)
Hemoglobin: 10 g/dL — ABNORMAL LOW (ref 12.0–15.0)
MCH: 24.8 pg — ABNORMAL LOW (ref 26.0–34.0)
MCHC: 30.9 g/dL (ref 30.0–36.0)
MCV: 80.4 fL (ref 80.0–100.0)
Platelets: 296 10*3/uL (ref 150–400)
RBC: 4.03 MIL/uL (ref 3.87–5.11)
RDW: 14.6 % (ref 11.5–15.5)
WBC: 7.3 10*3/uL (ref 4.0–10.5)
nRBC: 0 % (ref 0.0–0.2)

## 2020-09-04 LAB — TYPE AND SCREEN
ABO/RH(D): B POS
Antibody Screen: NEGATIVE

## 2020-09-04 LAB — GROUP B STREP BY PCR: Group B strep by PCR: POSITIVE — AB

## 2020-09-04 SURGERY — Surgical Case
Anesthesia: Epidural

## 2020-09-04 MED ORDER — PHENYLEPHRINE 40 MCG/ML (10ML) SYRINGE FOR IV PUSH (FOR BLOOD PRESSURE SUPPORT)
80.0000 ug | PREFILLED_SYRINGE | INTRAVENOUS | Status: DC | PRN
  Filled 2020-09-04: qty 10

## 2020-09-04 MED ORDER — SIMETHICONE 80 MG PO CHEW
80.0000 mg | CHEWABLE_TABLET | Freq: Three times a day (TID) | ORAL | Status: DC
  Administered 2020-09-05 – 2020-09-06 (×3): 80 mg via ORAL
  Filled 2020-09-04 (×3): qty 1

## 2020-09-04 MED ORDER — TERBUTALINE SULFATE 1 MG/ML IJ SOLN: 0.2500 mg | Freq: Once | INTRAMUSCULAR | Status: DC | PRN

## 2020-09-04 MED ORDER — LACTATED RINGERS IV SOLN
500.0000 mL | Freq: Once | INTRAVENOUS | Status: AC
  Administered 2020-09-04: 500 mL via INTRAVENOUS

## 2020-09-04 MED ORDER — DEXAMETHASONE SODIUM PHOSPHATE 10 MG/ML IJ SOLN
INTRAMUSCULAR | Status: AC
  Filled 2020-09-04: qty 1

## 2020-09-04 MED ORDER — DEXAMETHASONE SODIUM PHOSPHATE 10 MG/ML IJ SOLN
INTRAMUSCULAR | Status: DC | PRN
  Administered 2020-09-04: 10 mg via INTRAVENOUS

## 2020-09-04 MED ORDER — FENTANYL-BUPIVACAINE-NACL 0.5-0.125-0.9 MG/250ML-% EP SOLN
12.0000 mL/h | EPIDURAL | Status: DC | PRN
  Administered 2020-09-04: 12 mL/h via EPIDURAL
  Filled 2020-09-04: qty 250

## 2020-09-04 MED ORDER — NALBUPHINE HCL 10 MG/ML IJ SOLN
5.0000 mg | Freq: Once | INTRAMUSCULAR | Status: AC | PRN
  Administered 2020-09-04: 5 mg via INTRAVENOUS

## 2020-09-04 MED ORDER — KETOROLAC TROMETHAMINE 30 MG/ML IJ SOLN
INTRAMUSCULAR | Status: AC
  Filled 2020-09-04: qty 1

## 2020-09-04 MED ORDER — SOD CITRATE-CITRIC ACID 500-334 MG/5ML PO SOLN
30.0000 mL | ORAL | Status: DC | PRN
  Filled 2020-09-04: qty 30

## 2020-09-04 MED ORDER — OXYCODONE HCL 5 MG PO TABS
5.0000 mg | ORAL_TABLET | ORAL | Status: DC | PRN
  Administered 2020-09-05 – 2020-09-06 (×4): 5 mg via ORAL
  Administered 2020-09-06: 10 mg via ORAL
  Filled 2020-09-04: qty 2
  Filled 2020-09-04: qty 1
  Filled 2020-09-04: qty 2
  Filled 2020-09-04 (×3): qty 1

## 2020-09-04 MED ORDER — LACTATED RINGERS IV SOLN: 500.0000 mL | INTRAVENOUS | Status: DC | PRN

## 2020-09-04 MED ORDER — SODIUM CHLORIDE 0.9% FLUSH: 3.0000 mL | INTRAVENOUS | Status: DC | PRN

## 2020-09-04 MED ORDER — PHENYLEPHRINE 40 MCG/ML (10ML) SYRINGE FOR IV PUSH (FOR BLOOD PRESSURE SUPPORT)
80.0000 ug | PREFILLED_SYRINGE | INTRAVENOUS | Status: DC | PRN
  Administered 2020-09-04 (×2): 80 ug via INTRAVENOUS

## 2020-09-04 MED ORDER — PRENATAL MULTIVITAMIN CH
1.0000 | ORAL_TABLET | Freq: Every day | ORAL | Status: DC
  Administered 2020-09-06: 1 via ORAL
  Filled 2020-09-04: qty 1

## 2020-09-04 MED ORDER — EPHEDRINE 5 MG/ML INJ: 10.0000 mg | INTRAVENOUS | Status: DC | PRN

## 2020-09-04 MED ORDER — DIPHENHYDRAMINE HCL 50 MG/ML IJ SOLN: 12.5000 mg | INTRAMUSCULAR | Status: DC | PRN

## 2020-09-04 MED ORDER — DIPHENHYDRAMINE HCL 25 MG PO CAPS: 25.0000 mg | ORAL_CAPSULE | ORAL | Status: DC | PRN

## 2020-09-04 MED ORDER — LIDOCAINE HCL (PF) 1 % IJ SOLN
INTRAMUSCULAR | Status: DC | PRN
  Administered 2020-09-04: 10 mL via EPIDURAL

## 2020-09-04 MED ORDER — DIPHENHYDRAMINE HCL 25 MG PO CAPS
25.0000 mg | ORAL_CAPSULE | Freq: Four times a day (QID) | ORAL | Status: DC | PRN
  Administered 2020-09-05: 25 mg via ORAL
  Filled 2020-09-04: qty 1

## 2020-09-04 MED ORDER — LIDOCAINE HCL (PF) 1 % IJ SOLN: 30.0000 mL | INTRAMUSCULAR | Status: DC | PRN

## 2020-09-04 MED ORDER — ACETAMINOPHEN 500 MG PO TABS
1000.0000 mg | ORAL_TABLET | Freq: Four times a day (QID) | ORAL | Status: AC
  Administered 2020-09-04 – 2020-09-05 (×4): 1000 mg via ORAL
  Filled 2020-09-04 (×3): qty 2

## 2020-09-04 MED ORDER — OXYTOCIN-SODIUM CHLORIDE 30-0.9 UT/500ML-% IV SOLN
1.0000 m[IU]/min | INTRAVENOUS | Status: DC
Start: 2020-09-04 — End: 2020-09-04
  Filled 2020-09-04: qty 500

## 2020-09-04 MED ORDER — ONDANSETRON HCL 4 MG/2ML IJ SOLN
INTRAMUSCULAR | Status: DC | PRN
  Administered 2020-09-04: 4 mg via INTRAVENOUS

## 2020-09-04 MED ORDER — SIMETHICONE 80 MG PO CHEW: 80.0000 mg | CHEWABLE_TABLET | ORAL | Status: DC | PRN

## 2020-09-04 MED ORDER — PROMETHAZINE HCL 25 MG/ML IJ SOLN: 6.2500 mg | INTRAMUSCULAR | Status: DC | PRN

## 2020-09-04 MED ORDER — SCOPOLAMINE 1 MG/3DAYS TD PT72: 1.0000 | MEDICATED_PATCH | Freq: Once | TRANSDERMAL | Status: DC

## 2020-09-04 MED ORDER — DIBUCAINE (PERIANAL) 1 % EX OINT: 1.0000 "application " | TOPICAL_OINTMENT | CUTANEOUS | Status: DC | PRN

## 2020-09-04 MED ORDER — WITCH HAZEL-GLYCERIN EX PADS: 1.0000 "application " | MEDICATED_PAD | CUTANEOUS | Status: DC | PRN

## 2020-09-04 MED ORDER — ONDANSETRON HCL 4 MG/2ML IJ SOLN
INTRAMUSCULAR | Status: AC
  Filled 2020-09-04: qty 2

## 2020-09-04 MED ORDER — SENNOSIDES-DOCUSATE SODIUM 8.6-50 MG PO TABS
2.0000 | ORAL_TABLET | Freq: Every day | ORAL | Status: DC
  Administered 2020-09-05 – 2020-09-06 (×2): 2 via ORAL
  Filled 2020-09-04 (×2): qty 2

## 2020-09-04 MED ORDER — ONDANSETRON HCL 4 MG/2ML IJ SOLN: 4.0000 mg | Freq: Three times a day (TID) | INTRAMUSCULAR | Status: DC | PRN

## 2020-09-04 MED ORDER — FENTANYL CITRATE (PF) 100 MCG/2ML IJ SOLN: 50.0000 ug | INTRAMUSCULAR | Status: DC | PRN

## 2020-09-04 MED ORDER — PHENYLEPHRINE 40 MCG/ML (10ML) SYRINGE FOR IV PUSH (FOR BLOOD PRESSURE SUPPORT)
PREFILLED_SYRINGE | INTRAVENOUS | Status: AC
  Filled 2020-09-04: qty 10

## 2020-09-04 MED ORDER — MORPHINE SULFATE (PF) 0.5 MG/ML IJ SOLN
INTRAMUSCULAR | Status: AC
  Filled 2020-09-04: qty 10

## 2020-09-04 MED ORDER — SODIUM CHLORIDE 0.9 % IV SOLN
INTRAVENOUS | Status: AC
  Filled 2020-09-04: qty 500

## 2020-09-04 MED ORDER — OXYCODONE-ACETAMINOPHEN 5-325 MG PO TABS: 2.0000 | ORAL_TABLET | ORAL | Status: DC | PRN

## 2020-09-04 MED ORDER — MORPHINE SULFATE (PF) 0.5 MG/ML IJ SOLN
INTRAMUSCULAR | Status: DC | PRN
  Administered 2020-09-04: 3 mg via EPIDURAL

## 2020-09-04 MED ORDER — SODIUM BICARBONATE 8.4 % IV SOLN
INTRAVENOUS | Status: DC | PRN
  Administered 2020-09-04 (×2): 5 mL via EPIDURAL

## 2020-09-04 MED ORDER — KETOROLAC TROMETHAMINE 30 MG/ML IJ SOLN: 30.0000 mg | Freq: Four times a day (QID) | INTRAMUSCULAR | Status: AC | PRN

## 2020-09-04 MED ORDER — ENOXAPARIN SODIUM 40 MG/0.4ML IJ SOSY
40.0000 mg | PREFILLED_SYRINGE | INTRAMUSCULAR | Status: DC
  Administered 2020-09-05 – 2020-09-06 (×2): 40 mg via SUBCUTANEOUS
  Filled 2020-09-04 (×2): qty 0.4

## 2020-09-04 MED ORDER — MENTHOL 3 MG MT LOZG: 1.0000 | LOZENGE | OROMUCOSAL | Status: DC | PRN

## 2020-09-04 MED ORDER — ACETAMINOPHEN 325 MG PO TABS: 650.0000 mg | ORAL_TABLET | ORAL | Status: DC | PRN

## 2020-09-04 MED ORDER — OXYTOCIN-SODIUM CHLORIDE 30-0.9 UT/500ML-% IV SOLN: 2.5000 [IU]/h | INTRAVENOUS | Status: AC

## 2020-09-04 MED ORDER — OXYCODONE-ACETAMINOPHEN 5-325 MG PO TABS: 1.0000 | ORAL_TABLET | ORAL | Status: DC | PRN

## 2020-09-04 MED ORDER — KETOROLAC TROMETHAMINE 30 MG/ML IJ SOLN
30.0000 mg | Freq: Four times a day (QID) | INTRAMUSCULAR | Status: AC | PRN
  Administered 2020-09-04: 30 mg via INTRAVENOUS

## 2020-09-04 MED ORDER — SOD CITRATE-CITRIC ACID 500-334 MG/5ML PO SOLN
30.0000 mL | ORAL | Status: AC
  Administered 2020-09-04: 30 mL via ORAL

## 2020-09-04 MED ORDER — NALBUPHINE HCL 10 MG/ML IJ SOLN
5.0000 mg | INTRAMUSCULAR | Status: DC | PRN
Start: 2020-09-04 — End: 2020-09-06
  Administered 2020-09-04 (×2): 5 mg via INTRAVENOUS
  Filled 2020-09-04 (×3): qty 1

## 2020-09-04 MED ORDER — CEFAZOLIN SODIUM-DEXTROSE 2-4 GM/100ML-% IV SOLN
2.0000 g | INTRAVENOUS | Status: AC
  Administered 2020-09-04: 2 g via INTRAVENOUS

## 2020-09-04 MED ORDER — NALOXONE HCL 4 MG/10ML IJ SOLN
1.0000 ug/kg/h | INTRAVENOUS | Status: DC | PRN
  Filled 2020-09-04: qty 5

## 2020-09-04 MED ORDER — OXYTOCIN-SODIUM CHLORIDE 30-0.9 UT/500ML-% IV SOLN
INTRAVENOUS | Status: AC
  Filled 2020-09-04: qty 1000

## 2020-09-04 MED ORDER — OXYTOCIN-SODIUM CHLORIDE 30-0.9 UT/500ML-% IV SOLN: 2.5000 [IU]/h | INTRAVENOUS | Status: DC

## 2020-09-04 MED ORDER — ACETAMINOPHEN 500 MG PO TABS
1000.0000 mg | ORAL_TABLET | Freq: Four times a day (QID) | ORAL | Status: DC
  Administered 2020-09-04 – 2020-09-06 (×5): 1000 mg via ORAL
  Filled 2020-09-04 (×5): qty 2

## 2020-09-04 MED ORDER — SODIUM CHLORIDE 0.9 % IV SOLN
500.0000 mg | Freq: Once | INTRAVENOUS | Status: AC
  Administered 2020-09-04: 500 mg via INTRAVENOUS

## 2020-09-04 MED ORDER — TETANUS-DIPHTH-ACELL PERTUSSIS 5-2.5-18.5 LF-MCG/0.5 IM SUSY: 0.5000 mL | PREFILLED_SYRINGE | Freq: Once | INTRAMUSCULAR | Status: DC

## 2020-09-04 MED ORDER — NALBUPHINE HCL 10 MG/ML IJ SOLN: 5.0000 mg | INTRAMUSCULAR | Status: DC | PRN

## 2020-09-04 MED ORDER — OXYTOCIN-SODIUM CHLORIDE 30-0.9 UT/500ML-% IV SOLN
INTRAVENOUS | Status: DC | PRN
  Administered 2020-09-04: 30 [IU] via INTRAVENOUS

## 2020-09-04 MED ORDER — NALOXONE HCL 0.4 MG/ML IJ SOLN: 0.4000 mg | INTRAMUSCULAR | Status: DC | PRN

## 2020-09-04 MED ORDER — ONDANSETRON HCL 4 MG/2ML IJ SOLN: 4.0000 mg | Freq: Four times a day (QID) | INTRAMUSCULAR | Status: DC | PRN

## 2020-09-04 MED ORDER — IBUPROFEN 600 MG PO TABS
600.0000 mg | ORAL_TABLET | Freq: Four times a day (QID) | ORAL | Status: DC
  Administered 2020-09-05 – 2020-09-06 (×4): 600 mg via ORAL
  Filled 2020-09-04 (×4): qty 1

## 2020-09-04 MED ORDER — NALBUPHINE HCL 10 MG/ML IJ SOLN: 5.0000 mg | Freq: Once | INTRAMUSCULAR | Status: AC | PRN

## 2020-09-04 MED ORDER — MEPERIDINE HCL 25 MG/ML IJ SOLN: 6.2500 mg | INTRAMUSCULAR | Status: DC | PRN

## 2020-09-04 MED ORDER — KETOROLAC TROMETHAMINE 30 MG/ML IJ SOLN
30.0000 mg | Freq: Four times a day (QID) | INTRAMUSCULAR | Status: AC
  Administered 2020-09-04 – 2020-09-05 (×3): 30 mg via INTRAVENOUS
  Filled 2020-09-04 (×3): qty 1

## 2020-09-04 MED ORDER — PHENYLEPHRINE 40 MCG/ML (10ML) SYRINGE FOR IV PUSH (FOR BLOOD PRESSURE SUPPORT)
PREFILLED_SYRINGE | INTRAVENOUS | Status: DC | PRN
  Administered 2020-09-04: 40 ug via INTRAVENOUS
  Administered 2020-09-04: 80 ug via INTRAVENOUS
  Administered 2020-09-04: 40 ug via INTRAVENOUS

## 2020-09-04 MED ORDER — LACTATED RINGERS IV SOLN
INTRAVENOUS | Status: DC
  Administered 2020-09-04: 125 mL/h via INTRAVENOUS

## 2020-09-04 MED ORDER — FENTANYL CITRATE (PF) 100 MCG/2ML IJ SOLN: 25.0000 ug | INTRAMUSCULAR | Status: DC | PRN

## 2020-09-04 MED ORDER — OXYTOCIN BOLUS FROM INFUSION: 333.0000 mL | Freq: Once | INTRAVENOUS | Status: DC

## 2020-09-04 SURGICAL SUPPLY — 35 items
BENZOIN TINCTURE PRP APPL 2/3 (GAUZE/BANDAGES/DRESSINGS) ×2 IMPLANT
CHLORAPREP W/TINT 26ML (MISCELLANEOUS) ×2 IMPLANT
CLAMP CORD UMBIL (MISCELLANEOUS) IMPLANT
CLOTH BEACON ORANGE TIMEOUT ST (SAFETY) ×2 IMPLANT
DERMABOND ADVANCED (GAUZE/BANDAGES/DRESSINGS)
DERMABOND ADVANCED .7 DNX12 (GAUZE/BANDAGES/DRESSINGS) IMPLANT
DRSG OPSITE POSTOP 4X10 (GAUZE/BANDAGES/DRESSINGS) ×2 IMPLANT
ELECT REM PT RETURN 9FT ADLT (ELECTROSURGICAL) ×2
ELECTRODE REM PT RTRN 9FT ADLT (ELECTROSURGICAL) ×1 IMPLANT
EXTRACTOR VACUUM KIWI (MISCELLANEOUS) IMPLANT
GLOVE BIOGEL PI IND STRL 7.0 (GLOVE) ×3 IMPLANT
GLOVE BIOGEL PI INDICATOR 7.0 (GLOVE) ×3
GLOVE ECLIPSE 6.5 STRL STRAW (GLOVE) ×2 IMPLANT
GOWN STRL REUS W/TWL LRG LVL3 (GOWN DISPOSABLE) ×6 IMPLANT
KIT ABG SYR 3ML LUER SLIP (SYRINGE) IMPLANT
NEEDLE HYPO 25X5/8 SAFETYGLIDE (NEEDLE) IMPLANT
NS IRRIG 1000ML POUR BTL (IV SOLUTION) ×2 IMPLANT
PACK C SECTION WH (CUSTOM PROCEDURE TRAY) ×2 IMPLANT
PAD ABD 7.5X8 STRL (GAUZE/BANDAGES/DRESSINGS) ×2 IMPLANT
PAD OB MATERNITY 4.3X12.25 (PERSONAL CARE ITEMS) ×2 IMPLANT
PENCIL SMOKE EVAC W/HOLSTER (ELECTROSURGICAL) ×2 IMPLANT
RTRCTR C-SECT PINK 25CM LRG (MISCELLANEOUS) ×2 IMPLANT
STRIP CLOSURE SKIN 1/2X4 (GAUZE/BANDAGES/DRESSINGS) ×2 IMPLANT
SUT PLAIN 0 NONE (SUTURE) IMPLANT
SUT PLAIN 2 0 XLH (SUTURE) IMPLANT
SUT VIC AB 0 CT1 27 (SUTURE) ×4
SUT VIC AB 0 CT1 27XBRD ANBCTR (SUTURE) ×2 IMPLANT
SUT VIC AB 0 CTX 36 (SUTURE) ×6
SUT VIC AB 0 CTX36XBRD ANBCTRL (SUTURE) ×3 IMPLANT
SUT VIC AB 2-0 CT1 27 (SUTURE) ×2
SUT VIC AB 2-0 CT1 TAPERPNT 27 (SUTURE) ×1 IMPLANT
SUT VIC AB 4-0 KS 27 (SUTURE) ×2 IMPLANT
TOWEL OR 17X24 6PK STRL BLUE (TOWEL DISPOSABLE) ×2 IMPLANT
TRAY FOLEY W/BAG SLVR 14FR LF (SET/KITS/TRAYS/PACK) IMPLANT
WATER STERILE IRR 1000ML POUR (IV SOLUTION) ×2 IMPLANT

## 2020-09-04 NOTE — Lactation Note (Signed)
This note was copied from a baby's chart. Lactation Consultation Note  Patient Name: Tiffany Leblanc WIOMB'T Date: 09/04/2020 Reason for consult: Initial assessment;Term Age:22 hours  Initial visit with 9 hours old of a P2 mother. Mother states infant is sleepy and does not want to breastfeed. Parents have been formula feeding within guideline volume. LC reviewed hand expression and colostrum is easily expressed. Infant started cueing. LC assisted with latch, alignment and positioning. Noted suckling and swallowing. Mother denies discomfort or pain.   Educated mom about deep latch for an optimal breastfeeding session. Provided manual pump for stimulation and supplementation.   Plan:   1-Breastfeeding on demand, ensuring a deep, comfortable latch.  2-Undressing infant and place skin to skin when ready to breastfeed 3-Keep infant awake during breastfeeding session: massaging breast, infant's hand/shoulder/feet 4-Monitor voids and stools as signs good intake.  5-Contact LC as needed for feeds/support/concerns/questions   Maternal Data Has patient been taught Hand Expression?: Yes Does the patient have breastfeeding experience prior to this delivery?: Yes How long did the patient breastfeed?: did not breastfeed  Feeding Mother's Current Feeding Choice: Breast Milk and Formula  LATCH Score Latch: Grasps breast easily, tongue down, lips flanged, rhythmical sucking.  Audible Swallowing: Spontaneous and intermittent  Type of Nipple: Everted at rest and after stimulation  Comfort (Breast/Nipple): Soft / non-tender  Hold (Positioning): Assistance needed to correctly position infant at breast and maintain latch.  LATCH Score: 9   Lactation Tools Discussed/Used Tools: Pump;Flanges Flange Size: 27 Breast pump type: Manual Pump Education: Setup, frequency, and cleaning;Milk Storage Reason for Pumping: stimulation and supplementation Pumping frequency: as  needed  Interventions Interventions: Breast feeding basics reviewed;Assisted with latch;Skin to skin;Breast massage;Hand express;Adjust position;Support pillows;Position options;Expressed milk;Hand pump;Education  Discharge Pump: Manual WIC Program: Yes  Consult Status Consult Status: Follow-up Date: 09/05/20 Follow-up type: In-patient    Tiffany Leblanc 09/04/2020, 6:34 PM

## 2020-09-04 NOTE — Anesthesia Procedure Notes (Signed)
Epidural Patient location during procedure: OB Start time: 09/04/2020 3:33 AM End time: 09/04/2020 3:46 AM  Staffing Anesthesiologist: Lucretia Kern, MD Performed: anesthesiologist   Preanesthetic Checklist Completed: patient identified, IV checked, risks and benefits discussed, monitors and equipment checked, pre-op evaluation and timeout performed  Epidural Patient position: sitting Prep: DuraPrep Patient monitoring: heart rate, continuous pulse ox and blood pressure Approach: midline Location: L3-L4 Injection technique: LOR air  Needle:  Needle type: Tuohy  Needle gauge: 17 G Needle length: 9 cm Needle insertion depth: 7 cm Catheter type: closed end flexible Catheter size: 19 Gauge Catheter at skin depth: 12 cm Test dose: negative  Assessment Events: blood not aspirated, injection not painful, no injection resistance, no paresthesia and negative IV test  Additional Notes Reason for block:procedure for pain

## 2020-09-04 NOTE — MAU Note (Signed)
PT SAYS UC STRONG SINCE 0050 PNC WITH CLINIC NO IN OFFICE  DENIES HSV GBS- UNSURE

## 2020-09-04 NOTE — Anesthesia Preprocedure Evaluation (Addendum)
Anesthesia Evaluation  Patient identified by MRN, date of birth, ID band Patient awake    Reviewed: Allergy & Precautions, H&P , NPO status , Patient's Chart, lab work & pertinent test results  History of Anesthesia Complications Negative for: history of anesthetic complications  Airway Mallampati: II  TM Distance: >3 FB     Dental   Pulmonary neg pulmonary ROS, Current Smoker,    Pulmonary exam normal        Cardiovascular negative cardio ROS   Rhythm:regular Rate:Normal     Neuro/Psych negative neurological ROS  negative psych ROS   GI/Hepatic negative GI ROS, Neg liver ROS,   Endo/Other  negative endocrine ROS  Renal/GU negative Renal ROS  negative genitourinary   Musculoskeletal   Abdominal   Peds  Hematology  (+) Blood dyscrasia, anemia ,   Anesthesia Other Findings   Reproductive/Obstetrics (+) Pregnancy                             Anesthesia Physical Anesthesia Plan  ASA: 2 and emergent  Anesthesia Plan: Epidural   Post-op Pain Management:    Induction:   PONV Risk Score and Plan:   Airway Management Planned:   Additional Equipment:   Intra-op Plan:   Post-operative Plan:   Informed Consent: I have reviewed the patients History and Physical, chart, labs and discussed the procedure including the risks, benefits and alternatives for the proposed anesthesia with the patient or authorized representative who has indicated his/her understanding and acceptance.       Plan Discussed with:   Anesthesia Plan Comments: (**Epidural to C-section**)       Anesthesia Quick Evaluation
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
## 2020-09-04 NOTE — Progress Notes (Signed)
At bedside to review management.  Discussed with patient that each provider's practice of medicine is slightly different.  Reviewed with patient that current ACOG guidelines would recommend C-section for breech delivery.  Reviewed risk/benefit and alternatives of her options.   Questions and concerns were addressed- pt was ok with proceeding to C-section.  Risk benefits and alternatives of cesarean section were discussed with the patient including but not limited to infection, bleeding, damage to bowel , bladder and baby with the need for further surgery. Pt voiced understanding and desires to proceed.    Myna Hidalgo, DO Attending Obstetrician & Gynecologist, [facility] for [employer], [facility]

## 2020-09-04 NOTE — Discharge Summary (Signed)
Postpartum Discharge Summary  Patient Name: Tiffany Leblanc DOB: 2000/08/31 MRN: [id number]  Date of admission: 09/04/2020 Delivery date:09/04/2020  Delivering provider: Janyth Pupa  Date of discharge: 09/06/2020  Admitting diagnosis: Normal labor [O80, Z37.9] Intrauterine pregnancy: [redacted]w[redacted]d    Secondary diagnosis:  Principal Problem:   Cesarean delivery delivered Active Problems:   Drug use affecting pregnancy, antepartum   Depression   Food insecurity   History of polyhydramnios   History of gestational hypertension   Supervision of high risk pregnancy, antepartum   Normal labor   Breech presentation  Additional problems: as noted above  Discharge diagnosis: Cesarean delivery delivered                                    Post partum procedures: IV venofer Augmentation:  none Complications: None  Hospital course: Onset of Labor With Unplanned C/S   20y.o. yo G2P2002 at [id number] admitted in Active Labor on 09/04/2020; fetal presentation confirmed frank breech on arrival. Pt initially desired attempt at breech delivery s/p shared decision making. Patient progressed to 8cm but then had minimal cervical change. She was then counseled again by Dr. OzNelda Marseilleat which time pt elected for a primary Cesarean delivery. The patient went for cesarean section due to Malpresentation. Delivery details as follows: Membrane Rupture Time/Date: 3:57 AM ,09/04/2020   Delivery Method:C-Section, Low Transverse  Details of operation can be found in separate operative note. Patient had an uncomplicated postpartum course.  She is ambulating,tolerating a regular diet, passing flatus, and urinating well. Social work consulted prior to discharge. Patient is discharged home in stable condition 09/06/20.  Newborn Data: Birth date:09/04/2020  Birth time:9:21 AM  Gender:Female  Living status:Living  Apgars:5 ,7  Weight:2820 g   Magnesium Sulfate received: No BMZ received:  No Rhophylac:N/A MMR:N/A T-DaP:offered prior to delivery Flu: offered prior to delivery Transfusion:Yes (IV venofer)  Physical exam  Vitals:   09/05/20 0627 09/05/20 1500 09/05/20 2028 09/06/20 0505  BP: (!) 90/28 111/88 106/66 104/70  Pulse: 64 80 77 68  Resp: 18 18 18 18   Temp: 97.8 F (36.6 C) 98.1 F (36.7 C) 98.7 F (37.1 C) 98.2 F (36.8 C)  TempSrc: Oral Oral Oral Oral  SpO2: 100%  100% 100%  Weight:      Height:       General: alert, cooperative, and no distress Lochia: appropriate Uterine Fundus: firm Incision: Healing well with no significant drainage, No significant erythema, Honeycomb dressing is clean, dry, and intact DVT Evaluation: No evidence of DVT seen on physical exam. No cords or calf tenderness. No significant calf/ankle edema. Labs: Lab Results  Component Value Date   WBC 14.2 (H) 09/05/2020   HGB 8.3 (L) 09/05/2020   HCT 27.1 (L) 09/05/2020   MCV 79.9 (L) 09/05/2020   PLT 265 09/05/2020   CMP Latest Ref Rng & Units 08/18/2019  Glucose 70 - 99 mg/dL 116(H)  BUN 6 - 20 mg/dL 8  Creatinine 0.44 - 1.00 mg/dL 1.10(H)  Sodium 135 - 145 mmol/L 131(L)  Potassium 3.5 - 5.1 mmol/L 3.5  Chloride 98 - 111 mmol/L 99  CO2 22 - 32 mmol/L 21(L)  Calcium 8.9 - 10.3 mg/dL 8.6(L)  Total Protein 6.5 - 8.1 g/dL 7.3  Total Bilirubin 0.3 - 1.2 mg/dL 0.8  Alkaline Phos 38 - 126 U/L 87  AST 15 - 41 U/L 16  ALT 0 - 44  U/L 13   Edinburgh Score: Edinburgh Postnatal Depression Scale Screening Tool 09/06/2020  I have been able to laugh and see the funny side of things. (No Data)  I have looked forward with enjoyment to things. -  I have blamed myself unnecessarily when things went wrong. -  I have been anxious or worried for no good reason. -  I have felt scared or panicky for no good reason. -  Things have been getting on top of me. -  I have been so unhappy that I have had difficulty sleeping. -  I have felt sad or miserable. -  I have been so unhappy that I  have been crying. -  The thought of harming myself has occurred to me. Flavia Shipper Postnatal Depression Scale Total -     After visit meds:  Allergies as of 09/06/2020       Reactions   Coconut Oil    States hives with coconut soap        Medication List     STOP taking these medications    Blood Pressure Kit Devi       TAKE these medications    acetaminophen 500 MG tablet Commonly known as: TYLENOL Take 2 tablets (1,000 mg total) by mouth every 8 (eight) hours.   ibuprofen 600 MG tablet Commonly known as: ADVIL Take 1 tablet (600 mg total) by mouth every 8 (eight) hours.   multivitamin-prenatal 27-0.8 MG Tabs tablet Take 1 tablet by mouth daily at 12 noon.   oxyCODONE 5 MG immediate release tablet Commonly known as: Oxy IR/ROXICODONE Take 1 tablet (5 mg total) by mouth every 6 (six) hours as needed for severe pain or breakthrough pain.         Discharge home in stable condition Infant Feeding:  breast and bottle Infant Disposition:home with mother Discharge instruction: per After Visit Summary and Postpartum booklet. Activity: Advance as tolerated. Pelvic rest for 6 weeks.  Diet: routine diet Future Appointments:No future appointments. Follow up Visit: Message sent to [facility] by [facility].  Please schedule this patient for a In person postpartum visit in 6 weeks with the following provider: Any provider. Additional Postpartum F/U:Postpartum Depression checkup and Incision check 1 week  Low risk pregnancy complicated by:  s/p Cesarean for breech presentation, depression (no meds), limited prenatal care Delivery mode:  C-Section, Low Transverse  Anticipated Birth Control:  depo prior to discharge  Shamere Dilworth, Gildardo Cranker, MD OB Fellow, Faculty Practice 09/06/2020 11:59 AM

## 2020-09-04 NOTE — Op Note (Signed)
Tiffany Leblanc PROCEDURE DATE: 09/04/2020  PREOPERATIVE DIAGNOSES: Intrauterine pregnancy at [redacted]w[redacted]d weeks gestation; malpresentation: breech  POSTOPERATIVE DIAGNOSES: The same  PROCEDURE: Primary Low Transverse Cesarean Section  SURGEON:  Dr. Myna Hidalgo, MD  ASSISTANT:  Dr. Lynnda Shields, MD Dr. Derrel Nip, MD  ANESTHESIOLOGY TEAM: Anesthesiologist: Cecile Hearing, MD; Lucretia Kern, MD  INDICATIONS: Tiffany Leblanc is a 20 y.o. G2P1001 at [redacted]w[redacted]d here for cesarean section secondary to the indications listed under preoperative diagnoses; please see preoperative note for further details.  The risks of surgery were discussed with the patient including but were not limited to: bleeding which may require transfusion or reoperation; infection which may require antibiotics; injury to bowel, bladder, ureters or other surrounding organs; injury to the fetus; need for additional procedures including hysterectomy in the event of a life-threatening hemorrhage; formation of adhesions; placental abnormalities wth subsequent pregnancies; incisional problems; thromboembolic phenomenon and other postoperative/anesthesia complications.  The patient concurred with the proposed plan, giving informed written consent for the procedure.    FINDINGS:  Viable female infant in breech presentation.  Apgars 5, 7 and 8.  Clear amniotic fluid.  Intact placenta, three vessel cord.  Normal uterus, fallopian tubes and ovaries bilaterally.  ANESTHESIA: Epidural  INTRAVENOUS FLUIDS: 1300 ml   ESTIMATED BLOOD LOSS: 100 ml URINE OUTPUT:  100 ml SPECIMENS: Placenta sent to L&D COMPLICATIONS: None immediate  PROCEDURE IN DETAIL:  The patient preoperatively received intravenous antibiotics and had sequential compression devices applied to her lower extremities.  She was then taken to the operating room where the epidural anesthesia was dosed up to surgical level and was found to be adequate. She was then placed in a  dorsal supine position with a leftward tilt, and prepped and draped in a sterile manner.  A foley catheter was placed into her bladder and attached to constant gravity.  After an adequate timeout was performed, a Pfannenstiel skin incision was made with scalpel and carried through to the underlying layer of fascia. The fascia was incised in the midline, and this incision was extended bilaterally in blunt fashion. The rectus muscles were separated in the midline and the peritoneum was entered bluntly. The Alexis self-retaining retractor was introduced into the abdominal cavity.  Attention was turned to the lower uterine segment where a bladder flap was created sharply and bluntly. A low transverse hysterotomy was then made with a scalpel and extended bilaterally bluntly.  The infant was successfully delivered from breech presentation, the cord was clamped and cut after one minute, and the infant was handed over to the awaiting neonatology team. Uterine massage was then administered, and the placenta delivered intact with a three-vessel cord. The uterus was then cleared of clots and debris.  The hysterotomy was closed with 0 Vicryl in a running locked fashion, and an imbricating layer was also placed with 0 Vicryl.  A single figure-of-eight 0 Vicryl serosal stitches were placed to help with hemostasis.  The pelvis was cleared of all clot and debris. Hemostasis was confirmed on all surfaces.  The retractor was removed.  The peritoneum was closed with a 0 Vicryl running stitch. The fascia was then closed using 0 Vicryl in a running fashion.  The subcutaneous layer was irrigated, re-approximated with 2-0 plain gut interrupted stitches, and the skin was closed with a 4-0 Vicryl subcuticular stitch. The patient tolerated the procedure well. Sponge, instrument and needle counts were correct x 3.  She was taken to the recovery room in stable condition.  Sheila Oats, MD OB Fellow, Faculty Practice 09/04/2020 9:53  AM

## 2020-09-04 NOTE — Anesthesia Postprocedure Evaluation (Signed)
Anesthesia Post Note  Patient: Tiffany Leblanc  Procedure(s) Performed: CESAREAN SECTION     Patient location during evaluation: PACU Anesthesia Type: Epidural Level of consciousness: awake, awake and alert and oriented Pain management: pain level controlled Vital Signs Assessment: post-procedure vital signs reviewed and stable Respiratory status: spontaneous breathing, nonlabored ventilation and respiratory function stable Cardiovascular status: stable Postop Assessment: no headache, no backache, epidural receding, patient able to bend at knees, no signs of nausea or vomiting and spinal receding Anesthetic complications: no   No notable events documented.  Last Vitals:  Vitals:   09/04/20 1113 09/04/20 1457  BP:  119/72  Pulse: 84 70  Resp:  20  Temp:  36.5 C  SpO2: 99% 99%    Last Pain:  Vitals:   09/04/20 1113  TempSrc:   PainSc: 0-No pain   Pain Goal:                   Cecile Hearing

## 2020-09-04 NOTE — Lactation Note (Signed)
This note was copied from a baby's chart. Lactation Consultation Note  Patient Name: Tiffany Leblanc Date: 09/04/2020 Age:22 hours  LC in to room for initial visit. Mother is eating and politely asks LC to come back at another time.   Tiffany Leblanc 09/04/2020, 3:48 PM

## 2020-09-04 NOTE — Transfer of Care (Signed)
Immediate Anesthesia Transfer of Care Note  Patient: Marli A Fajardo  Procedure(s) Performed: CESAREAN SECTION  Patient Location: PACU  Anesthesia Type:Epidural  Level of Consciousness: awake, alert  and oriented  Airway & Oxygen Therapy: Patient Spontanous Breathing  Post-op Assessment: Report given to RN and Post -op Vital signs reviewed and stable  Post vital signs: Reviewed and stable  Last Vitals:  Vitals Value Taken Time  BP    Temp    Pulse    Resp    SpO2      Last Pain:  Vitals:   09/04/20 0750  TempSrc:   PainSc: 0-No pain         Complications: No notable events documented.

## 2020-09-05 LAB — CBC
HCT: 27.1 % — ABNORMAL LOW (ref 36.0–46.0)
Hemoglobin: 8.3 g/dL — ABNORMAL LOW (ref 12.0–15.0)
MCH: 24.5 pg — ABNORMAL LOW (ref 26.0–34.0)
MCHC: 30.6 g/dL (ref 30.0–36.0)
MCV: 79.9 fL — ABNORMAL LOW (ref 80.0–100.0)
Platelets: 265 10*3/uL (ref 150–400)
RBC: 3.39 MIL/uL — ABNORMAL LOW (ref 3.87–5.11)
RDW: 14.6 % (ref 11.5–15.5)
WBC: 14.2 10*3/uL — ABNORMAL HIGH (ref 4.0–10.5)
nRBC: 0 % (ref 0.0–0.2)

## 2020-09-05 MED ORDER — SODIUM CHLORIDE 0.9 % IV SOLN
500.0000 mg | Freq: Once | INTRAVENOUS | Status: AC
  Administered 2020-09-05: 500 mg via INTRAVENOUS
  Filled 2020-09-05: qty 25

## 2020-09-05 MED ORDER — MEDROXYPROGESTERONE ACETATE 150 MG/ML IM SUSP
150.0000 mg | Freq: Once | INTRAMUSCULAR | Status: AC
  Administered 2020-09-06: 150 mg via INTRAMUSCULAR
  Filled 2020-09-05: qty 1

## 2020-09-05 NOTE — Clinical Social Work Maternal (Addendum)
CLINICAL SOCIAL WORK MATERNAL/CHILD NOTE  Patient Details  Name: Tiffany Leblanc MRN: [id number] Date of Birth: 2000/03/29  Date:  09/05/2020  Clinical Social Worker Initiating Note:  Darcus Austin, MSW, LCSWA Date/Time: Initiated:  09/05/20/1219     Child's Name:  Dickey Gave   Biological Parents:  Mother, Father Donnal Moat HQPR, 01/27/96, [phone])   Need for Interpreter:  None   Reason for Referral:  Late or No Prenatal Care  , Current Substance Use/Substance Use During Pregnancy     Address:  [place] [address]    Phone number:  [phone] (home)     Additional phone number: [phone] (FOB)  Household Members/Support Persons (HM/SP):   Household Member/Support Person 1, Household Member/Support Person 2 (FOB's grandmother.)   HM/SP Name Relationship DOB or Age  HM/SP -1 Denarius Daye FOB 01/27/96  HM/SP -2 Lamonte Richer Daughter 07/14/18  HM/SP -3        HM/SP -4        HM/SP -5        HM/SP -6        HM/SP -7        HM/SP -8          Natural Supports (not living in the home):  Spouse/significant other, Parent   Professional Supports:     Employment: Unemployed   Type of Work:     Education:  Programmer, systems   Homebound arranged:    Museum/gallery curator Resources:  [employer]   Other Resources:  Physicist, medical  , [place] Considerations Which May Impact Care:    Strengths:  Pediatrician chosen   Psychotropic Medications:         Pediatrician:    [place]  Pediatrician List:   [facility] [place]      Pediatrician Fax Number:    Risk Factors/Current Problems:  Substance Use  , Other (Comment) (Limited PNC, and homelessness.)   Cognitive State:  Able to Concentrate  , Alert  , Linear Thinking  , Goal Oriented     Mood/Affect:  Interested  , Comfortable  , Bright  , Calm  , Happy  , Relaxed      CSW Assessment: CSW met with MOB to complete consult for limited prenatal care, depression, and substance use during pregnancy. CSW observed MOB, and FOB sitting on couch, while FOB was bonding with infant. MOB gave CSW verbal consent to complete consult while FOB was present. CSW explained role and reason for consult. MOB was pleasant, polite, and engaged with CSW. MOB reported, transportation issues and experiencing homelessness was the reason for limited prenatal care. MOB reported, her, FOB, and Lamonte Richer (daughter) are currently living with FOB's grandmother. However, MOB reported, there are days FOB's grandmother does not want them there. Therefore, MOB reported, some days her and her family are homeless. MOB reported, history of depression, during her first pregnancy, due to FOB's absence. MOB reported, since then, she has been able to manage symptoms without medication needed. MOB reported, history of THC and her last use being two months ago. MOB reported, the reason for use, was for appetite, and nausea purposes. MOB denied any other illicit substances use.  MOB reported, history of CPS involvement. MOB reported, CPS was notified about her co sleeping with her first child (2020). CSW informed MOB of Drug  Screen Policy and MOB was understanding of protocol. CSW made CPS report to [place] with [facility]. CSW will continue to follow the CDS and will notify CPS if any additional information is needed.   CSW provided education regarding the baby blues period vs. perinatal mood disorders, discussed treatment and gave resources for mental health follow up if concerns arise. CSW recommends self- evaluation during the postpartum time period using the New Mom Checklist from Postpartum Progress and encouraged MOB to contact a medical professional if symptoms are noted at any time.   When CSW asked MOB of her emotions since delivery. MOB reported, she feels, "fine, and happy". MOB reported,  FOB, and her mother are supportive. MOB denied SI, HI when CSW assessed for safety.   MOB reported, she receive WIC, and food stamps. MOB reported, infant's pediatrician will be at [facility] for [place] and there may be barriers to follow up infant's care. MOB reported, she is unemployed and does not have transportation. MOB reported, she is familiar with [facility], but was never successful with adequate transportation. CSW inform MOB, of Medicaid transportation process for additional support. MOB reported, she does not have all essentials needed to care for infant. MOB reported, infant has a bassinet, but does not have a car seat.    CSW provided education on sudden infant death syndrome (SIDS).  CSW provided housing resources.   CSW made CPS report to [place] with [facility]. CSW will continue to follow the CDS and will notify CPS if any additional information is needed.   CSW Plan/Description:  Sudden Infant Death Syndrome (SIDS) Education, Perinatal Mood and Anxiety Disorder (PMADs) Education, Child Protective Service Report  , Other Information/Referral to [employer], CSW Will Continue to Monitor Umbilical Cord Tissue Drug Screen Results and Make Report if [facility], [place], No Further Intervention Required/No Barriers to Discharge    Collene Gobble 09/05/2020, 12:28 PM  Darcus Austin, MSW, LCSW-A Clinical Social Worker- Weekends [phone]

## 2020-09-05 NOTE — Progress Notes (Signed)
Subjective: Postpartum Day #1: Cesarean Delivery pLTCS for breech Patient reports tolerating PO and no problems voiding.  Denies s/s anemia; expresses a desire for her son to be circumcised, but she isn't sure if she wants this to be done at the hospital vs outpatient; would like Depo for contraception; breastfeeding going well.  Objective: Vital signs in last 24 hours: Temp:  [97.5 F (36.4 C)-98.3 F (36.8 C)] 97.8 F (36.6 C) (07/17 0627) Pulse Rate:  [62-86] 64 (07/17 0627) Resp:  [13-20] 18 (07/17 0627) BP: (90-133)/(28-89) 90/28 (07/17 0627) SpO2:  [97 %-100 %] 100 % (07/17 0177)  Physical Exam:  General: alert, cooperative, and no distress Lochia: appropriate Uterine Fundus: firm Incision: honeycomb; stained and unchanged DVT Evaluation: No evidence of DVT seen on physical exam.  Recent Labs    09/04/20 0245 09/05/20 0455  HGB 10.0* 8.3*  HCT 32.4* 27.1*    Assessment/Plan: Status post Cesarean section. Doing well postoperatively.  Continue current care.  -Consented to circumcision should she decide to have this done her- not placed in infant's chart.  -Depo ordered for prior to d/c.  -Venofer for postop anemia.  Arabella Merles CNM 09/05/2020, 8:49 AM

## 2020-09-05 NOTE — Plan of Care (Signed)
  Problem: Education: Goal: Knowledge of condition will improve Outcome: Completed/Met   Problem: Activity: Goal: Will verbalize the importance of balancing activity with adequate rest periods Outcome: Completed/Met Goal: Ability to tolerate increased activity will improve Outcome: Completed/Met   Problem: Life Cycle: Goal: Chance of risk for complications during the postpartum period will decrease Outcome: Completed/Met   

## 2020-09-06 MED ORDER — OXYCODONE HCL 5 MG PO TABS: 5.0000 mg | ORAL_TABLET | Freq: Four times a day (QID) | ORAL | 0 refills | Status: DC | PRN

## 2020-09-06 MED ORDER — IBUPROFEN 600 MG PO TABS
600.0000 mg | ORAL_TABLET | Freq: Three times a day (TID) | ORAL | 0 refills | Status: DC
Start: 2020-09-06 — End: 2021-08-03

## 2020-09-06 MED ORDER — ACETAMINOPHEN 500 MG PO TABS: 1000.0000 mg | ORAL_TABLET | Freq: Three times a day (TID) | ORAL | 0 refills | Status: DC

## 2020-09-06 MED ORDER — PRENATAL 27-0.8 MG PO TABS: 1.0000 | ORAL_TABLET | Freq: Every day | ORAL | 3 refills | Status: DC

## 2020-09-06 NOTE — Progress Notes (Signed)
Entered the room for bedside report and found mom cosleeping with baby. Baby moved to bassinet.

## 2020-09-06 NOTE — Social Work (Signed)
CSW escorted [facility] CPS SW Pricilla Loveless to [facility] room to complete assessment.   Per CPS SW, no barriers to discharge.   Manfred Arch, MSW, [employer] Clinical Social Work [employer] and [employer] [phone]

## 2020-09-06 NOTE — Social Work (Signed)
CSW informed by RN that MOB continue to co-sleep with infant. Per RN, MOB co-sleeping throughout the night, as well as twice this morning.  CSW contacted [place] CPS to report co-sleeping. CPS report previously made for infant UDS THC positive.   CSW delivered car seat to room.   Barriers to discharge pending assessment by CPS SW today. RN updated.  Takira Sherrin, MSW, LCSWA Clinical Social Work [facility] [phone] 

## 2020-09-06 NOTE — Progress Notes (Signed)
Entered room to do morning assessment. Mom once again noted co sleeping. MOB awake this time, educated on safe sleep. Not receptive. Has already been screened out by social work. Social work called to notify of cosleeping and that patient still needs a car seat.

## 2020-09-16 ENCOUNTER — Telehealth (HOSPITAL_COMMUNITY): Payer: Self-pay

## 2020-09-16 NOTE — Telephone Encounter (Addendum)
"  I'm doing fine. I need to get an appointment with my doctor for my pain. My incision and everything from the waist down is sore." RN asks patient if she has been taking prescribed pain medication? "No, it doesn't help anyway so I'm not taking it." RN explained that a C/S is major abdominal surgery and encouraged patient to take prescribed pain medication. RN asked patient if she has her OB phone number? "I don't know who my OB is. I don't have there phone number." RN looked in patients DC summary and found who she is to follow up with. Provided patient with [facility] healthcare phone number. RN told patient to call her OB after our phone call and speak with them about her pain control concerns. RN also told patient that she can come to [facility] MAU and we are happy to help her. "I took my dressing off today. They told me to take it off 3 days after I got home but I took it off today. Is that ok?" RN confirmed with patient that it is a honeycomb dressing. RN told patient that it's good she took the dressing off and to not put another dressing on to leave incision open to air. RN reviewed incision care and signs of infection. RN again told patient to call her OB after our phone call. Patient declines any other questions or concerns.  "He's fine. We went to the pediatrician yesterday. He is sleeping in a bassinet." Patient has no questions or concerns about baby.   EPDS score is 0.  Marcelino Duster [facility] 09/15/2020,1546

## 2020-09-22 ENCOUNTER — Ambulatory Visit: Payer: Medicaid Other

## 2020-09-22 ENCOUNTER — Telehealth: Payer: Self-pay

## 2020-09-22 NOTE — Telephone Encounter (Signed)
Called pt; unable to leave VM. MyChart message sent.

## 2020-10-13 ENCOUNTER — Encounter: Payer: Self-pay | Admitting: Obstetrics and Gynecology

## 2020-10-13 ENCOUNTER — Ambulatory Visit: Payer: Medicaid Other | Admitting: Obstetrics and Gynecology

## 2020-10-13 NOTE — Progress Notes (Signed)
Patient did not keep her postpartum appointment for 10/13/2020.  Cornelia Copa MD Attending Center for [employer] Midwife)

## 2020-10-18 ENCOUNTER — Ambulatory Visit: Payer: Medicaid Other | Admitting: Family Medicine

## 2020-11-10 ENCOUNTER — Ambulatory Visit: Payer: Medicaid Other | Admitting: Family Medicine

## 2021-08-03 ENCOUNTER — Inpatient Hospital Stay (HOSPITAL_COMMUNITY)
Admission: AD | Admit: 2021-08-03 | Discharge: 2021-08-03 | Disposition: A | Discharge status: Home / Self Care | Payer: Medicaid Other | Attending: Obstetrics & Gynecology | Admitting: Obstetrics & Gynecology

## 2021-08-03 ENCOUNTER — Encounter: Payer: Self-pay | Admitting: Student

## 2021-08-03 ENCOUNTER — Inpatient Hospital Stay (HOSPITAL_COMMUNITY): Payer: Medicaid Other

## 2021-08-03 ENCOUNTER — Ambulatory Visit (HOSPITAL_COMMUNITY)
Admission: EM | Admit: 2021-08-03 | Discharge: 2021-08-03 | Payer: Medicaid Other | Attending: Internal Medicine | Admitting: Internal Medicine

## 2021-08-03 DIAGNOSIS — Z3A14 14 weeks gestation of pregnancy: Secondary | ICD-10-CM | POA: Diagnosis not present

## 2021-08-03 DIAGNOSIS — R109 Unspecified abdominal pain: Secondary | ICD-10-CM | POA: Insufficient documentation

## 2021-08-03 DIAGNOSIS — O039 Complete or unspecified spontaneous abortion without complication: Secondary | ICD-10-CM

## 2021-08-03 DIAGNOSIS — Z3A00 Weeks of gestation of pregnancy not specified: Secondary | ICD-10-CM

## 2021-08-03 DIAGNOSIS — R1030 Lower abdominal pain, unspecified: Secondary | ICD-10-CM

## 2021-08-03 DIAGNOSIS — O034 Incomplete spontaneous abortion without complication: Secondary | ICD-10-CM

## 2021-08-03 DIAGNOSIS — O4690 Antepartum hemorrhage, unspecified, unspecified trimester: Secondary | ICD-10-CM | POA: Diagnosis not present

## 2021-08-03 DIAGNOSIS — O26892 Other specified pregnancy related conditions, second trimester: Secondary | ICD-10-CM | POA: Diagnosis present

## 2021-08-03 DIAGNOSIS — Z3201 Encounter for pregnancy test, result positive: Secondary | ICD-10-CM

## 2021-08-03 HISTORY — DX: Trichomoniasis, unspecified: A59.9

## 2021-08-03 LAB — CBC
HCT: 34.9 % — ABNORMAL LOW (ref 36.0–46.0)
Hemoglobin: 11.7 g/dL — ABNORMAL LOW (ref 12.0–15.0)
MCH: 29.4 pg (ref 26.0–34.0)
MCHC: 33.5 g/dL (ref 30.0–36.0)
MCV: 87.7 fL (ref 80.0–100.0)
Platelets: 316 10*3/uL (ref 150–400)
RBC: 3.98 MIL/uL (ref 3.87–5.11)
RDW: 12.5 % (ref 11.5–15.5)
WBC: 7.8 10*3/uL (ref 4.0–10.5)
nRBC: 0 % (ref 0.0–0.2)

## 2021-08-03 LAB — TYPE AND SCREEN
ABO/RH(D): B POS
Antibody Screen: NEGATIVE

## 2021-08-03 LAB — POC URINE PREG, ED
Preg Test, Ur: POSITIVE — AB
Preg Test, Ur: POSITIVE — AB

## 2021-08-03 LAB — HCG, QUANTITATIVE, PREGNANCY: hCG, Beta Chain, Quant, S: 46544 m[IU]/mL — ABNORMAL HIGH (ref <5)

## 2021-08-03 LAB — WET PREP, GENITAL
Clue Cells Wet Prep HPF POC: NONE SEEN
Sperm: NONE SEEN
Trich, Wet Prep: NONE SEEN
WBC, Wet Prep HPF POC: <10 (ref <10)
Yeast Wet Prep HPF POC: NONE SEEN

## 2021-08-03 MED ORDER — HYDROMORPHONE HCL 1 MG/ML IJ SOLN
0.5000 mg | Freq: Once | INTRAMUSCULAR | Status: AC
  Administered 2021-08-03: 0.5 mg via INTRAVENOUS
  Filled 2021-08-03: qty 1

## 2021-08-03 MED ORDER — IBUPROFEN 800 MG PO TABS
800.0000 mg | ORAL_TABLET | Freq: Once | ORAL | Status: AC
  Administered 2021-08-03: 800 mg via ORAL
  Filled 2021-08-03: qty 1

## 2021-08-03 MED ORDER — OXYCODONE-ACETAMINOPHEN 5-325 MG PO TABS
2.0000 | ORAL_TABLET | Freq: Once | ORAL | Status: AC
  Administered 2021-08-03: 2 via ORAL
  Filled 2021-08-03: qty 2

## 2021-08-03 MED ORDER — ONDANSETRON HCL 4 MG/2ML IJ SOLN
4.0000 mg | Freq: Once | INTRAMUSCULAR | Status: AC
  Administered 2021-08-03: 4 mg via INTRAVENOUS
  Filled 2021-08-03: qty 2

## 2021-08-03 MED ORDER — METOCLOPRAMIDE HCL 10 MG PO TABS
10.0000 mg | ORAL_TABLET | Freq: Once | ORAL | Status: AC
  Administered 2021-08-03: 10 mg via ORAL
  Filled 2021-08-03: qty 1

## 2021-08-03 MED ORDER — IBUPROFEN 800 MG PO TABS: 800.0000 mg | ORAL_TABLET | Freq: Three times a day (TID) | ORAL | 0 refills | Status: DC | PRN

## 2021-08-03 MED ORDER — MISOPROSTOL 200 MCG PO TABS
800.0000 ug | ORAL_TABLET | Freq: Once | ORAL | Status: AC
  Administered 2021-08-03: 800 ug via BUCCAL
  Filled 2021-08-03: qty 4

## 2021-08-03 MED ORDER — LACTATED RINGERS IV SOLN: INTRAVENOUS | Status: DC

## 2021-08-03 MED ORDER — METOCLOPRAMIDE HCL 10 MG PO TABS: 10.0000 mg | ORAL_TABLET | Freq: Three times a day (TID) | ORAL | 0 refills | Status: DC | PRN

## 2021-08-03 MED ORDER — OXYCODONE-ACETAMINOPHEN 5-325 MG PO TABS: 1.0000 | ORAL_TABLET | Freq: Four times a day (QID) | ORAL | 0 refills | Status: DC | PRN

## 2021-08-03 NOTE — MAU Note (Signed)
Tiffany Leblanc is a 21 y.o. at Unknown here in MAU reporting: found out preg the middle of May, had not been seen.  Had one day of bleeding the week she found out. Pain started last night, became worse today LMP: sometime in April Onset of complaint: last night Pain score: 10 Vitals:   08/03/21 1229  BP: 120/68  Pulse: 86  Resp: 20  Temp: 97.6 F (36.4 C)      Lab orders placed from triage:

## 2021-08-03 NOTE — MAU Note (Signed)
At approximately 1350, Tori RT at bedside. States pt felt something come out and would like for a nurse to come look. This RN went to the bedside and noted a sac with fetus on pt's pad. Len Blalock CNM brought to bedside. Will proceed with u/s.

## 2021-08-03 NOTE — Discharge Instructions (Signed)
Clinic will call with follow up appointments. You can discuss/decide on birth control at that time.   No Sex until after follow up appointments.

## 2021-08-03 NOTE — Discharge Instructions (Addendum)
Patient to go to the maternity assessment unit for further evaluation via [facility] due to severity of symptoms.
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
## 2021-08-04 LAB — GC/CHLAMYDIA PROBE AMP (~~LOC~~) NOT AT ARMC
Chlamydia: NEGATIVE
Comment: NEGATIVE
Comment: NORMAL
Neisseria Gonorrhea: NEGATIVE

## 2021-08-08 LAB — SURGICAL PATHOLOGY

## 2021-08-10 ENCOUNTER — Ambulatory Visit: Payer: Medicaid Other

## 2021-09-01 ENCOUNTER — Ambulatory Visit: Payer: Self-pay | Admitting: Obstetrics & Gynecology

## 2022-03-03 ENCOUNTER — Encounter (HOSPITAL_COMMUNITY): Payer: Self-pay | Admitting: Emergency Medicine

## 2022-03-03 ENCOUNTER — Ambulatory Visit (HOSPITAL_COMMUNITY)
Admission: EM | Admit: 2022-03-03 | Discharge: 2022-03-03 | Disposition: A | Discharge status: Home / Self Care | Payer: Medicaid Other | Attending: Physician Assistant | Admitting: Physician Assistant

## 2022-03-03 DIAGNOSIS — N926 Irregular menstruation, unspecified: Secondary | ICD-10-CM

## 2022-03-03 DIAGNOSIS — Z3201 Encounter for pregnancy test, result positive: Secondary | ICD-10-CM

## 2022-03-03 DIAGNOSIS — Z113 Encounter for screening for infections with a predominantly sexual mode of transmission: Secondary | ICD-10-CM | POA: Insufficient documentation

## 2022-03-03 DIAGNOSIS — Z202 Contact with and (suspected) exposure to infections with a predominantly sexual mode of transmission: Secondary | ICD-10-CM

## 2022-03-03 LAB — POC URINE PREG, ED: Preg Test, Ur: POSITIVE — AB

## 2022-03-03 LAB — POCT URINALYSIS DIPSTICK, ED / UC
Bilirubin Urine: NEGATIVE
Glucose, UA: NEGATIVE mg/dL
Hgb urine dipstick: NEGATIVE
Ketones, ur: 15 mg/dL — AB
Leukocytes,Ua: NEGATIVE
Nitrite: NEGATIVE
Protein, ur: NEGATIVE mg/dL
Specific Gravity, Urine: 1.02 (ref 1.005–1.030)
Urobilinogen, UA: 0.2 mg/dL (ref 0.0–1.0)
pH: 7 (ref 5.0–8.0)

## 2022-03-03 LAB — HEPATITIS C ANTIBODY: HCV Ab: NONREACTIVE

## 2022-03-03 LAB — HIV ANTIBODY (ROUTINE TESTING W REFLEX): HIV Screen 4th Generation wRfx: NONREACTIVE

## 2022-03-03 MED ORDER — DOXYLAMINE-PYRIDOXINE 10-10 MG PO TBEC: 1.0000 | DELAYED_RELEASE_TABLET | Freq: Every day | ORAL | 1 refills | Status: DC

## 2022-03-03 MED ORDER — PENICILLIN G BENZATHINE 1200000 UNIT/2ML IM SUSY
2.4000 10*6.[IU] | PREFILLED_SYRINGE | Freq: Once | INTRAMUSCULAR | Status: AC
  Administered 2022-03-03: 2.4 10*6.[IU] via INTRAMUSCULAR

## 2022-03-03 MED ORDER — PENICILLIN G BENZATHINE 1200000 UNIT/2ML IM SUSY
PREFILLED_SYRINGE | INTRAMUSCULAR | Status: AC
  Filled 2022-03-03: qty 4

## 2022-03-03 MED ORDER — PRENATAL 27-0.8 MG PO TABS: 1.0000 | ORAL_TABLET | Freq: Every day | ORAL | 3 refills | Status: DC
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
## 2022-03-03 NOTE — Discharge Instructions (Signed)
We treated you for syphilis.  We will contact you when interested additional treatment based on your labs.  You are pregnant.  Please call and schedule an appointment with OB/GYN as soon as possible.  Start a prenatal vitamin.  Use Diclegis at night to help with your nausea.  Drink plenty of fluid and eat small frequent meals.  Avoid anything that is not cooked (such as sushi).  Even at lunch meat should be heated up in order to avoid exposure to certain diseases.  Avoid raising your core body temperature such as going in a sauna or in a hot tub.  Avoid alcohol, caffeine, smoking, any drug use.  If you develop any pelvic pain, abdominal pain, bleeding you need to go to the emergency room.

## 2022-03-03 NOTE — ED Triage Notes (Signed)
Pt reports that her partner tested positive for syphilis and she needs testing/treatment.  Pt reports LMP was November and possibly be pregnant.

## 2022-03-04 LAB — URINE CULTURE

## 2022-03-04 LAB — SYPHILIS: RPR W/REFLEX TO RPR TITER AND TREPONEMAL ANTIBODIES, TRADITIONAL SCREENING AND DIAGNOSIS ALGORITHM: RPR Ser Ql: NONREACTIVE

## 2022-03-05 LAB — CERVICOVAGINAL ANCILLARY ONLY
Bacterial Vaginitis (gardnerella): POSITIVE — AB
Candida Glabrata: NEGATIVE
Candida Vaginitis: POSITIVE — AB
Chlamydia: NEGATIVE
Comment: NEGATIVE
Comment: NEGATIVE
Comment: NEGATIVE
Comment: NEGATIVE
Comment: NEGATIVE
Comment: NORMAL
Neisseria Gonorrhea: NEGATIVE
Trichomonas: NEGATIVE

## 2022-03-07 ENCOUNTER — Telehealth (HOSPITAL_COMMUNITY): Payer: Self-pay | Admitting: Emergency Medicine

## 2022-03-07 MED ORDER — METRONIDAZOLE 0.75 % VA GEL: 1.0000 | Freq: Every day | VAGINAL | 0 refills | Status: AC

## 2022-03-07 MED ORDER — CLOTRIMAZOLE 1 % VA CREA: 1.0000 | TOPICAL_CREAM | Freq: Every day | VAGINAL | 0 refills | Status: DC

## 2022-04-18 ENCOUNTER — Inpatient Hospital Stay (HOSPITAL_BASED_OUTPATIENT_CLINIC_OR_DEPARTMENT_OTHER): Payer: Medicaid Other

## 2022-04-18 ENCOUNTER — Other Ambulatory Visit: Payer: Self-pay

## 2022-04-18 ENCOUNTER — Inpatient Hospital Stay (HOSPITAL_COMMUNITY)
Admission: AD | Admit: 2022-04-18 | Discharge: 2022-04-18 | Disposition: A | Discharge status: Home / Self Care | Payer: Medicaid Other | Attending: Family Medicine | Admitting: Family Medicine

## 2022-04-18 ENCOUNTER — Encounter (HOSPITAL_COMMUNITY): Payer: Self-pay

## 2022-04-18 DIAGNOSIS — O09292 Supervision of pregnancy with other poor reproductive or obstetric history, second trimester: Secondary | ICD-10-CM | POA: Insufficient documentation

## 2022-04-18 DIAGNOSIS — O26892 Other specified pregnancy related conditions, second trimester: Secondary | ICD-10-CM

## 2022-04-18 DIAGNOSIS — O26842 Uterine size-date discrepancy, second trimester: Secondary | ICD-10-CM

## 2022-04-18 DIAGNOSIS — Z3A15 15 weeks gestation of pregnancy: Secondary | ICD-10-CM

## 2022-04-18 DIAGNOSIS — O0932 Supervision of pregnancy with insufficient antenatal care, second trimester: Secondary | ICD-10-CM | POA: Diagnosis not present

## 2022-04-18 DIAGNOSIS — O26899 Other specified pregnancy related conditions, unspecified trimester: Secondary | ICD-10-CM

## 2022-04-18 DIAGNOSIS — R102 Pelvic and perineal pain: Secondary | ICD-10-CM | POA: Insufficient documentation

## 2022-04-18 LAB — CBC
HCT: 31.3 % — ABNORMAL LOW (ref 36.0–46.0)
Hemoglobin: 10.8 g/dL — ABNORMAL LOW (ref 12.0–15.0)
MCH: 28.4 pg (ref 26.0–34.0)
MCHC: 34.5 g/dL (ref 30.0–36.0)
MCV: 82.4 fL (ref 80.0–100.0)
Platelets: 283 10*3/uL (ref 150–400)
RBC: 3.8 MIL/uL — ABNORMAL LOW (ref 3.87–5.11)
RDW: 13.5 % (ref 11.5–15.5)
WBC: 4.6 10*3/uL (ref 4.0–10.5)
nRBC: 0 % (ref 0.0–0.2)

## 2022-04-18 LAB — WET PREP, GENITAL
Clue Cells Wet Prep HPF POC: NONE SEEN
Sperm: NONE SEEN
Trich, Wet Prep: NONE SEEN
WBC, Wet Prep HPF POC: <10 (ref <10)
Yeast Wet Prep HPF POC: NONE SEEN

## 2022-04-18 LAB — COMPREHENSIVE METABOLIC PANEL
ALT: 11 U/L (ref 0–44)
AST: 17 U/L (ref 15–41)
Albumin: 3.1 g/dL — ABNORMAL LOW (ref 3.5–5.0)
Alkaline Phosphatase: 39 U/L (ref 38–126)
Anion gap: 9 (ref 5–15)
BUN: 5 mg/dL — ABNORMAL LOW (ref 6–20)
CO2: 23 mmol/L (ref 22–32)
Calcium: 8.4 mg/dL — ABNORMAL LOW (ref 8.9–10.3)
Chloride: 105 mmol/L (ref 98–111)
Creatinine, Ser: 0.59 mg/dL (ref 0.44–1.00)
GFR, Estimated: >60 mL/min (ref >60)
Glucose, Bld: 91 mg/dL (ref 70–99)
Potassium: 3.5 mmol/L (ref 3.5–5.1)
Sodium: 137 mmol/L (ref 135–145)
Total Bilirubin: 0.4 mg/dL (ref 0.3–1.2)
Total Protein: 6.4 g/dL — ABNORMAL LOW (ref 6.5–8.1)

## 2022-04-18 LAB — URINALYSIS, ROUTINE W REFLEX MICROSCOPIC
Bilirubin Urine: NEGATIVE
Glucose, UA: NEGATIVE mg/dL
Hgb urine dipstick: NEGATIVE
Ketones, ur: NEGATIVE mg/dL
Leukocytes,Ua: NEGATIVE
Nitrite: NEGATIVE
Protein, ur: NEGATIVE mg/dL
Specific Gravity, Urine: 1.025 (ref 1.005–1.030)
pH: 6 (ref 5.0–8.0)

## 2022-04-18 MED ORDER — ACETAMINOPHEN 500 MG PO TABS
1000.0000 mg | ORAL_TABLET | Freq: Once | ORAL | Status: AC
  Administered 2022-04-18: 1000 mg via ORAL
  Filled 2022-04-18: qty 2

## 2022-04-18 MED ORDER — ACETAMINOPHEN 500 MG PO TABS: 1000.0000 mg | ORAL_TABLET | Freq: Three times a day (TID) | ORAL | 0 refills | Status: DC | PRN

## 2022-04-18 NOTE — MAU Note (Signed)
Tiffany Leblanc is a 22 y.o. at 65w1dhere in MAU reporting: a lot of lower abdominal and vaginal pressure ongoing for a week.  LMP: 01/09/22 approximately  Onset of complaint: ongoing  Pain score: 6/10  Vitals:   04/18/22 1134  BP: 111/67  Pulse: 92  Resp: 16  Temp: 98.1 F (36.7 C)  SpO2: 100%     FHT:157  Lab orders placed from triage: UA
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
## 2022-04-18 NOTE — Discharge Instructions (Signed)
Prenatal Care Providers           [facility]  [address] [phone]  [facility]   [address]  [phone]  [facility] @ [place] [facility]       [address] [phone]            [facility] @ [place]     [address] [phone]          [facility] @ [place]   [address] [phone]  [facility]  [address] [phone]     [facility]  [address]   [phone]     [facility]  Phone: [phone]  [facility]  Phone: [phone]  [facility] Phone: [phone]  [facility] Phone: [phone]  [facility] Phone: [phone]  [facility] Phone: [phone]  [facility]  Phone: [phone]  

## 2022-04-19 LAB — GC/CHLAMYDIA PROBE AMP (~~LOC~~) NOT AT ARMC
Chlamydia: NEGATIVE
Comment: NEGATIVE
Comment: NORMAL
Neisseria Gonorrhea: NEGATIVE

## 2022-06-11 ENCOUNTER — Ambulatory Visit (HOSPITAL_COMMUNITY)
Admission: EM | Admit: 2022-06-11 | Discharge: 2022-06-11 | Disposition: A | Discharge status: Home / Self Care | Payer: Medicaid Other | Attending: Family Medicine | Admitting: Family Medicine

## 2022-06-11 ENCOUNTER — Encounter (HOSPITAL_COMMUNITY): Payer: Self-pay | Admitting: *Deleted

## 2022-06-11 DIAGNOSIS — L232 Allergic contact dermatitis due to cosmetics: Secondary | ICD-10-CM | POA: Diagnosis not present

## 2022-06-11 MED ORDER — CETIRIZINE HCL 10 MG PO TABS: 10.0000 mg | ORAL_TABLET | Freq: Every day | ORAL | 0 refills | Status: DC | PRN

## 2022-06-11 MED ORDER — METHYLPREDNISOLONE ACETATE 80 MG/ML IJ SUSP
INTRAMUSCULAR | Status: AC
  Filled 2022-06-11: qty 1

## 2022-06-11 MED ORDER — PREDNISONE 20 MG PO TABS: 40.0000 mg | ORAL_TABLET | Freq: Every day | ORAL | 0 refills | Status: AC

## 2022-06-11 MED ORDER — METHYLPREDNISOLONE ACETATE 80 MG/ML IJ SUSP
80.0000 mg | Freq: Once | INTRAMUSCULAR | Status: AC
  Administered 2022-06-11: 80 mg via INTRAMUSCULAR

## 2022-06-11 NOTE — ED Triage Notes (Addendum)
Pt reports waking up with generalized pruritic, fine, bumpy, sandpaper rash this AM. 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 Pt states she is approx [redacted] wks pregnant.

## 2022-06-11 NOTE — Discharge Instructions (Addendum)
We have given you a shot of methylprednisolone 80 mg here in the office  Take prednisone 20 mg--2 daily for 5 days  Zyrtec/cetirizine 10 mg tablet--take 1 daily as needed for allergy or itching.  If the itching is not much improved by tomorrow, then please call your OB/GYN provider.  They may want to do more blood work and repeat some the blood work you had earlier in the pregnancy.
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
## 2022-06-15 ENCOUNTER — Telehealth: Payer: Medicaid Other

## 2022-06-15 DIAGNOSIS — Z8759 Personal history of other complications of pregnancy, childbirth and the puerperium: Secondary | ICD-10-CM

## 2022-06-15 NOTE — Progress Notes (Deleted)
New OB Intake  I connected with Tiffany Leblanc  on 06/15/22 at  9:15 AM EDT by MyChart Video Visit and verified that I am speaking with the correct person using two identifiers. Nurse is located at [facility] and pt is located at home.  I discussed the limitations, risks, security and privacy concerns of performing an evaluation and management service by telephone and the availability of in person appointments. I also discussed with the patient that there may be a patient responsible charge related to this service. The patient expressed understanding and agreed to proceed.  I explained I am completing New OB Intake today. We discussed EDD of 10/04/2022 that is based on ultrasound. Pt is G4/P2. I reviewed her allergies, medications, Medical/Surgical/OB history, and appropriate screenings. I informed her of [facility] services. [facility] information placed in AVS. Based on history, this is a {Pregnancy Risk:28335} pregnancy.  Patient Active Problem List   Diagnosis Date Noted   History of gestational hypertension 07/13/2018   History of polyhydramnios 05/10/2018   Depression 03/01/2018   Food insecurity 03/01/2018   Family history of congenital anomaly of cardiovascular system 02/08/2018   Closed fracture of right zygomatic arch 07/05/2015    Concerns addressed today  Delivery Plans Plans to deliver at [facility]. Patient given information for [facility] website for more information about [facility].   MyChart/Babyscripts MyChart access verified. I explained pt will have some visits in office and some virtually. Babyscripts instructions given and order placed. Patient verifies receipt of registration text/e-mail. Account successfully created and app downloaded.  Blood Pressure Cuff/Weight Scale Blood pressure cuff ordered for patient to pick-up from [employer]. Explained after first prenatal appt pt will check weekly and document in Babyscripts.   Anatomy US Explained first  scheduled [place] will be around 19 weeks. Anatomy US scheduled for *** at ***. Pt notified to arrive at ***.  Labs Discussed Avelina Laine genetic screening with patient. Would like both Panorama and Horizon drawn at new OB visit. Routine prenatal labs needed.  COVID Vaccine Patient {HAS/HAS NOT:20194} had COVID vaccine.   Is patient a CenteringPregnancy candidate?  {Accepted:19197::"Accepted","Not a Candidate","Declined"} Declined due to {Declined:19197::"Schedule","Childcare","Group setting","Support person concern","Declined to say","Enrolled in [facility]","***"} Not a candidate due to {Not a Candidate:19197::"DM","CHTN, medication controlled","Language barrier",">28 weeks","Multiple gestation (mono-mono or mono-di)","Complex coordination of care needed","***"} If accepted,    Is patient a Mom+Baby Combined Care candidate?  Not a candidate   If accepted, Mom+Baby staff notified  Social Determinants of Health Food Insecurity: Patient denies food insecurity. WIC Referral: Patient is interested in referral to [facility].  Transportation: Patient denies transportation needs. Childcare: Discussed no children allowed at ultrasound appointments. Offered childcare services; patient declines childcare services at this time.  Interested in [place]? If yes, send referral and doula dot phrase.   First visit review I reviewed new OB appt with patient. I explained they will have a provider visit that includes new ob labs, pap smear and listening to baby heartbeat. Explained pt will be seen by Dr. Ladon Applebaum at first visit; encounter routed to appropriate provider. Explained that patient will be seen by pregnancy navigator following visit with provider.   Lowry Bowl, CMA 06/15/2022  9:01 AM

## 2022-06-22 ENCOUNTER — Encounter: Payer: Medicaid Other | Admitting: Family Medicine

## 2022-07-10 ENCOUNTER — Inpatient Hospital Stay (HOSPITAL_COMMUNITY)
Admission: AD | Admit: 2022-07-10 | Discharge: 2022-07-10 | Disposition: A | Discharge status: Home / Self Care | Payer: Medicaid Other | Attending: Obstetrics and Gynecology | Admitting: Obstetrics and Gynecology

## 2022-07-10 ENCOUNTER — Encounter (HOSPITAL_COMMUNITY): Payer: Self-pay | Admitting: Obstetrics and Gynecology

## 2022-07-10 DIAGNOSIS — O99322 Drug use complicating pregnancy, second trimester: Secondary | ICD-10-CM | POA: Diagnosis not present

## 2022-07-10 DIAGNOSIS — M545 Low back pain, unspecified: Secondary | ICD-10-CM | POA: Diagnosis not present

## 2022-07-10 DIAGNOSIS — R102 Pelvic and perineal pain: Secondary | ICD-10-CM | POA: Insufficient documentation

## 2022-07-10 DIAGNOSIS — Z3A27 27 weeks gestation of pregnancy: Secondary | ICD-10-CM | POA: Diagnosis not present

## 2022-07-10 DIAGNOSIS — O99891 Other specified diseases and conditions complicating pregnancy: Secondary | ICD-10-CM | POA: Insufficient documentation

## 2022-07-10 DIAGNOSIS — Z79899 Other long term (current) drug therapy: Secondary | ICD-10-CM | POA: Diagnosis not present

## 2022-07-10 DIAGNOSIS — O212 Late vomiting of pregnancy: Secondary | ICD-10-CM | POA: Insufficient documentation

## 2022-07-10 DIAGNOSIS — Z87891 Personal history of nicotine dependence: Secondary | ICD-10-CM | POA: Diagnosis not present

## 2022-07-10 DIAGNOSIS — O26892 Other specified pregnancy related conditions, second trimester: Secondary | ICD-10-CM | POA: Diagnosis not present

## 2022-07-10 DIAGNOSIS — F1290 Cannabis use, unspecified, uncomplicated: Secondary | ICD-10-CM | POA: Insufficient documentation

## 2022-07-10 DIAGNOSIS — R824 Acetonuria: Secondary | ICD-10-CM | POA: Insufficient documentation

## 2022-07-10 DIAGNOSIS — Z3689 Encounter for other specified antenatal screening: Secondary | ICD-10-CM

## 2022-07-10 DIAGNOSIS — O26899 Other specified pregnancy related conditions, unspecified trimester: Secondary | ICD-10-CM

## 2022-07-10 LAB — WET PREP, GENITAL
Clue Cells Wet Prep HPF POC: NONE SEEN
Sperm: NONE SEEN
Trich, Wet Prep: NONE SEEN
WBC, Wet Prep HPF POC: >=10 — AB (ref <10)
Yeast Wet Prep HPF POC: NONE SEEN

## 2022-07-10 LAB — URINALYSIS, ROUTINE W REFLEX MICROSCOPIC
Bilirubin Urine: NEGATIVE
Glucose, UA: NEGATIVE mg/dL
Hgb urine dipstick: NEGATIVE
Ketones, ur: 80 mg/dL — AB
Leukocytes,Ua: NEGATIVE
Nitrite: NEGATIVE
Protein, ur: NEGATIVE mg/dL
Specific Gravity, Urine: 1.011 (ref 1.005–1.030)
pH: 6 (ref 5.0–8.0)
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
## 2022-07-10 NOTE — Discharge Instructions (Signed)

## 2022-07-10 NOTE — MAU Note (Signed)
...  Tiffany Leblanc is a 22 y.o. at [redacted]w[redacted]d here in MAU reporting: Lower back and vaginal pain. She reports these pains only occur when the baby moves as well as when she lays down flat. She reports she has continued to experience these pains since being seen in MAU back in February. Denies VB or LOF. +FM. Denies recent IC.  Patient asking if she could find out the gender today.  [facility] Does not have another appointment scheduled.   Onset of complaint: Ongoing  Pain score:  0 at rest 7/10 lower back 9/10 vagina  FHT: 145 initial external Lab orders placed from triage:  UA

## 2022-07-11 LAB — GC/CHLAMYDIA PROBE AMP (~~LOC~~) NOT AT ARMC
Chlamydia: NEGATIVE
Comment: NEGATIVE
Comment: NORMAL
Neisseria Gonorrhea: NEGATIVE

## 2022-07-11 NOTE — Progress Notes (Signed)
Patient did not show for appointment.   

## 2022-07-24 ENCOUNTER — Encounter: Payer: Medicaid Other | Admitting: Obstetrics and Gynecology

## 2022-08-28 ENCOUNTER — Encounter: Payer: Self-pay | Admitting: Family Medicine

## 2022-08-28 ENCOUNTER — Other Ambulatory Visit: Payer: Self-pay

## 2022-08-28 ENCOUNTER — Ambulatory Visit (INDEPENDENT_AMBULATORY_CARE_PROVIDER_SITE_OTHER): Payer: Medicaid Other | Admitting: Family Medicine

## 2022-08-28 VITALS — BP 111/62 | HR 90 | Wt 160.0 lb

## 2022-08-28 DIAGNOSIS — Z87768 Personal history of other specified (corrected) congenital malformations of integument, limbs and musculoskeletal system: Secondary | ICD-10-CM | POA: Diagnosis not present

## 2022-08-28 DIAGNOSIS — Z3A34 34 weeks gestation of pregnancy: Secondary | ICD-10-CM

## 2022-08-28 DIAGNOSIS — Z8759 Personal history of other complications of pregnancy, childbirth and the puerperium: Secondary | ICD-10-CM

## 2022-08-28 DIAGNOSIS — O0993 Supervision of high risk pregnancy, unspecified, third trimester: Secondary | ICD-10-CM | POA: Insufficient documentation

## 2022-08-28 DIAGNOSIS — O0933 Supervision of pregnancy with insufficient antenatal care, third trimester: Secondary | ICD-10-CM | POA: Diagnosis not present

## 2022-08-28 DIAGNOSIS — Z98891 History of uterine scar from previous surgery: Secondary | ICD-10-CM | POA: Insufficient documentation

## 2022-08-28 HISTORY — DX: Supervision of pregnancy with insufficient antenatal care, third trimester: O09.33

## 2022-08-28 HISTORY — DX: Personal history of other specified (corrected) congenital malformations of integument, limbs and musculoskeletal system: Z87.768
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
## 2022-08-29 LAB — COMPREHENSIVE METABOLIC PANEL
ALT: 6 IU/L (ref 0–32)
AST: 15 IU/L (ref 0–40)
Albumin: 3.5 g/dL — ABNORMAL LOW (ref 4.0–5.0)
Alkaline Phosphatase: 101 IU/L (ref 44–121)
BUN/Creatinine Ratio: 12 (ref 9–23)
BUN: 7 mg/dL (ref 6–20)
Bilirubin Total: <0.2 mg/dL (ref 0.0–1.2)
CO2: 18 mmol/L — ABNORMAL LOW (ref 20–29)
Calcium: 8.7 mg/dL (ref 8.7–10.2)
Chloride: 104 mmol/L (ref 96–106)
Creatinine, Ser: 0.58 mg/dL (ref 0.57–1.00)
Globulin, Total: 2.8 g/dL (ref 1.5–4.5)
Glucose: 70 mg/dL (ref 70–99)
Potassium: 4.2 mmol/L (ref 3.5–5.2)
Sodium: 137 mmol/L (ref 134–144)
Total Protein: 6.3 g/dL (ref 6.0–8.5)
eGFR: 130 mL/min/{1.73_m2} (ref >59)

## 2022-08-29 LAB — CBC/D/PLT+RPR+RH+ABO+RUBIGG...
Antibody Screen: NEGATIVE
Basophils Absolute: 0 10*3/uL (ref 0.0–0.2)
Basos: 1 %
EOS (ABSOLUTE): 0.2 10*3/uL (ref 0.0–0.4)
Eos: 4 %
HCV Ab: NONREACTIVE
HIV Screen 4th Generation wRfx: NONREACTIVE
Hematocrit: 28.6 % — ABNORMAL LOW (ref 34.0–46.6)
Hemoglobin: 9.1 g/dL — ABNORMAL LOW (ref 11.1–15.9)
Hepatitis B Surface Ag: NEGATIVE
Immature Grans (Abs): 0 10*3/uL (ref 0.0–0.1)
Immature Granulocytes: 0 %
Lymphocytes Absolute: 1.8 10*3/uL (ref 0.7–3.1)
Lymphs: 32 %
MCH: 24.9 pg — ABNORMAL LOW (ref 26.6–33.0)
MCHC: 31.8 g/dL (ref 31.5–35.7)
MCV: 78 fL — ABNORMAL LOW (ref 79–97)
Monocytes Absolute: 0.7 10*3/uL (ref 0.1–0.9)
Monocytes: 13 %
Neutrophils Absolute: 2.8 10*3/uL (ref 1.4–7.0)
Neutrophils: 50 %
Platelets: 261 10*3/uL (ref 150–450)
RBC: 3.65 x10E6/uL — ABNORMAL LOW (ref 3.77–5.28)
RDW: 13.4 % (ref 11.7–15.4)
RPR Ser Ql: NONREACTIVE
Rh Factor: POSITIVE
Rubella Antibodies, IGG: 2.16 index (ref >0.99)
WBC: 5.5 10*3/uL (ref 3.4–10.8)

## 2022-08-29 LAB — PROTEIN / CREATININE RATIO, URINE
Creatinine, Urine: 117.1 mg/dL
Protein, Ur: 12.4 mg/dL
Protein/Creat Ratio: 106 mg/g creat (ref 0–200)

## 2022-08-29 LAB — HEMOGLOBIN A1C
Est. average glucose Bld gHb Est-mCnc: 114 mg/dL
Hgb A1c MFr Bld: 5.6 % (ref 4.8–5.6)

## 2022-08-29 LAB — HCV INTERPRETATION

## 2022-08-30 ENCOUNTER — Encounter: Payer: Self-pay | Admitting: *Deleted

## 2022-08-30 LAB — URINE CULTURE, OB REFLEX

## 2022-08-30 LAB — CULTURE, OB URINE

## 2022-09-02 ENCOUNTER — Other Ambulatory Visit: Payer: Self-pay | Admitting: Family Medicine

## 2022-09-02 DIAGNOSIS — O99013 Anemia complicating pregnancy, third trimester: Secondary | ICD-10-CM

## 2022-09-02 DIAGNOSIS — O99019 Anemia complicating pregnancy, unspecified trimester: Secondary | ICD-10-CM | POA: Insufficient documentation

## 2022-09-02 DIAGNOSIS — D509 Iron deficiency anemia, unspecified: Secondary | ICD-10-CM | POA: Insufficient documentation

## 2022-09-02 HISTORY — DX: Anemia complicating pregnancy, third trimester: O99.013

## 2022-09-02 MED ORDER — FERROUS SULFATE 325 (65 FE) MG PO TABS: 325.0000 mg | ORAL_TABLET | Freq: Every day | ORAL | 0 refills | Status: DC

## 2022-09-04 ENCOUNTER — Other Ambulatory Visit: Payer: Self-pay

## 2022-09-04 DIAGNOSIS — O0993 Supervision of high risk pregnancy, unspecified, third trimester: Secondary | ICD-10-CM

## 2022-09-04 LAB — PANORAMA PRENATAL TEST FULL PANEL:PANORAMA TEST PLUS 5 ADDITIONAL MICRODELETIONS: FETAL FRACTION: 31.6

## 2022-09-05 ENCOUNTER — Other Ambulatory Visit: Payer: Self-pay

## 2022-09-05 ENCOUNTER — Other Ambulatory Visit: Payer: Medicaid Other

## 2022-09-05 DIAGNOSIS — O0993 Supervision of high risk pregnancy, unspecified, third trimester: Secondary | ICD-10-CM

## 2022-09-06 ENCOUNTER — Telehealth: Payer: Self-pay

## 2022-09-06 ENCOUNTER — Other Ambulatory Visit: Payer: Self-pay | Admitting: Family Medicine

## 2022-09-06 ENCOUNTER — Encounter: Payer: Self-pay | Admitting: Family Medicine

## 2022-09-06 LAB — GLUCOSE TOLERANCE, 2 HOURS W/ 1HR
Glucose, 1 hour: 133 mg/dL (ref 70–179)
Glucose, 2 hour: 94 mg/dL (ref 70–152)
Glucose, Fasting: 86 mg/dL (ref 70–91)

## 2022-09-06 LAB — CBC
Hematocrit: 27.6 % — ABNORMAL LOW (ref 34.0–46.6)
Hemoglobin: 8.9 g/dL — ABNORMAL LOW (ref 11.1–15.9)
MCH: 25 pg — ABNORMAL LOW (ref 26.6–33.0)
MCHC: 32.2 g/dL (ref 31.5–35.7)
MCV: 78 fL — ABNORMAL LOW (ref 79–97)
Platelets: 244 10*3/uL (ref 150–450)
RBC: 3.56 x10E6/uL — ABNORMAL LOW (ref 3.77–5.28)
RDW: 13.3 % (ref 11.7–15.4)
WBC: 6.2 10*3/uL (ref 3.4–10.8)

## 2022-09-06 LAB — HIV ANTIBODY (ROUTINE TESTING W REFLEX): HIV Screen 4th Generation wRfx: NONREACTIVE

## 2022-09-06 LAB — RPR: RPR Ser Ql: NONREACTIVE

## 2022-09-06 NOTE — Telephone Encounter (Signed)
Auth Submission: NO AUTH NEEDED Site of care: Site of care: [facility] Payer: Medicaid Medication & CPT/J Code(s) submitted: Venofer (Iron Sucrose) J1756  Auth type: Buy/Bill Units/visits requested: 200mg  x 5 doses  Approval from: 09/06/2022 to 01/07/2023

## 2022-09-12 ENCOUNTER — Ambulatory Visit: Payer: Medicaid Other

## 2022-09-12 MED ORDER — DIPHENHYDRAMINE HCL 25 MG PO CAPS: 25.0000 mg | ORAL_CAPSULE | Freq: Once | ORAL | Status: AC

## 2022-09-12 MED ORDER — SODIUM CHLORIDE 0.9 % IV SOLN
200.0000 mg | Freq: Once | INTRAVENOUS | Status: AC
  Filled 2022-09-12: qty 10

## 2022-09-12 MED ORDER — ACETAMINOPHEN 325 MG PO TABS: 650.0000 mg | ORAL_TABLET | Freq: Once | ORAL | Status: AC

## 2022-09-14 ENCOUNTER — Ambulatory Visit: Payer: Medicaid Other

## 2022-09-14 ENCOUNTER — Encounter: Payer: Medicaid Other | Admitting: Obstetrics and Gynecology

## 2022-09-14 ENCOUNTER — Encounter: Payer: Self-pay | Admitting: Obstetrics and Gynecology

## 2022-09-14 MED ORDER — SODIUM CHLORIDE 0.9 % IV SOLN
200.0000 mg | Freq: Once | INTRAVENOUS | Status: AC
  Filled 2022-09-14: qty 10

## 2022-09-14 MED ORDER — ACETAMINOPHEN 325 MG PO TABS: 650.0000 mg | ORAL_TABLET | Freq: Once | ORAL | Status: AC

## 2022-09-14 MED ORDER — DIPHENHYDRAMINE HCL 25 MG PO CAPS: 25.0000 mg | ORAL_CAPSULE | Freq: Once | ORAL | Status: AC

## 2022-09-16 NOTE — Progress Notes (Signed)
Patient did not keep her return OB appointment for 09/14/2022.  Cornelia Copa MD Attending Center for [employer] Midwife)

## 2022-09-21 ENCOUNTER — Ambulatory Visit: Payer: Medicaid Other | Attending: Family Medicine

## 2022-09-21 ENCOUNTER — Ambulatory Visit: Payer: Medicaid Other

## 2022-09-22 ENCOUNTER — Encounter: Payer: Self-pay | Admitting: Obstetrics and Gynecology

## 2022-09-22 ENCOUNTER — Encounter: Payer: Medicaid Other | Admitting: Obstetrics and Gynecology

## 2022-09-25 NOTE — Progress Notes (Signed)
Patient did not keep her 8/2 return OB appointment.  Cornelia Copa MD Attending Center for [employer] [facility])

## 2022-10-02 ENCOUNTER — Encounter: Payer: Medicaid Other | Admitting: Family Medicine

## 2022-10-02 ENCOUNTER — Telehealth: Payer: Self-pay

## 2022-10-02 ENCOUNTER — Encounter: Payer: Self-pay | Admitting: Family Medicine

## 2022-10-02 NOTE — Telephone Encounter (Signed)
Called and verified patient identity using 2 forms of ID.  Patient was informed that she missed her scheduled appointment today. She stated that she is currently in [facility] and wants to know if she could have her future appointments schedule in [place] due to transportation. I let Ms.Caison know that we do not have any "sister" offices located in [place] and offered [place] location as an option per Dr. Shawnie Pons, however, she declined stating that she would not be able to travel there.   Ms. Grajewski asked if we could provide transportation for her and I informed her that she would have to call Medicaid to see if they could provide transportation.  [employer] number was given to patient along with instructions to seek transportation. Estrin verbalized understanding.  Either myself or another clinical staff member will call to follow up.  Alesia Morin  10/02/22

## 2022-10-02 NOTE — Progress Notes (Signed)
Patient did not keep appointment today. She will be called to reschedule.  

## 2022-10-09 ENCOUNTER — Encounter: Payer: Medicaid Other | Admitting: Obstetrics and Gynecology

## 2022-12-08 ENCOUNTER — Encounter (HOSPITAL_COMMUNITY): Payer: Self-pay

## 2022-12-08 ENCOUNTER — Ambulatory Visit (HOSPITAL_COMMUNITY)
Admission: EM | Admit: 2022-12-08 | Discharge: 2022-12-08 | Disposition: A | Discharge status: Home / Self Care | Payer: Medicaid Other
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
## 2023-06-13 ENCOUNTER — Ambulatory Visit: Admitting: *Deleted

## 2023-06-13 DIAGNOSIS — Z3201 Encounter for pregnancy test, result positive: Secondary | ICD-10-CM

## 2023-06-13 DIAGNOSIS — Z3200 Encounter for pregnancy test, result unknown: Secondary | ICD-10-CM

## 2023-06-13 DIAGNOSIS — Z3689 Encounter for other specified antenatal screening: Secondary | ICD-10-CM

## 2023-06-13 LAB — POCT PREGNANCY, URINE: Preg Test, Ur: POSITIVE — AB

## 2023-06-13 NOTE — Progress Notes (Signed)
 Pt submitted urine for pregnancy test which is positive. I called and informed her of results. She reports unsure LMP 12/11/22 which yields EDD 09/17/23, now [redacted]w[redacted]d. Pt stated she knew she was pregnant for a long time but just hasn't had opportunity to come in for care. She endorses good FM, no vaginal bleeding and no abdominal pain. Pt will be scheduled for anatomy ultrasound @ [facility] as well as prenatal care in this office. She was advised to go to [facility] if she has urgent pregnancy issue. Pt voiced understanding of all information and instructions given.

## 2023-07-05 ENCOUNTER — Other Ambulatory Visit (HOSPITAL_COMMUNITY)
Admission: RE | Admit: 2023-07-05 | Discharge: 2023-07-05 | Disposition: A | Discharge status: Home / Self Care | Source: Ambulatory Visit | Attending: Obstetrics and Gynecology | Admitting: Obstetrics and Gynecology

## 2023-07-05 ENCOUNTER — Other Ambulatory Visit: Payer: Self-pay

## 2023-07-05 ENCOUNTER — Ambulatory Visit (INDEPENDENT_AMBULATORY_CARE_PROVIDER_SITE_OTHER): Admitting: Obstetrics and Gynecology

## 2023-07-05 ENCOUNTER — Encounter: Payer: Self-pay | Admitting: Obstetrics and Gynecology

## 2023-07-05 VITALS — BP 112/42 | HR 89 | Wt 164.0 lb

## 2023-07-05 DIAGNOSIS — O09893 Supervision of other high risk pregnancies, third trimester: Secondary | ICD-10-CM

## 2023-07-05 DIAGNOSIS — Z8759 Personal history of other complications of pregnancy, childbirth and the puerperium: Secondary | ICD-10-CM | POA: Diagnosis not present

## 2023-07-05 DIAGNOSIS — Z98891 History of uterine scar from previous surgery: Secondary | ICD-10-CM | POA: Diagnosis not present

## 2023-07-05 DIAGNOSIS — Z3A28 28 weeks gestation of pregnancy: Secondary | ICD-10-CM | POA: Diagnosis not present

## 2023-07-05 DIAGNOSIS — O0933 Supervision of pregnancy with insufficient antenatal care, third trimester: Secondary | ICD-10-CM | POA: Diagnosis not present

## 2023-07-05 DIAGNOSIS — O0993 Supervision of high risk pregnancy, unspecified, third trimester: Secondary | ICD-10-CM | POA: Insufficient documentation

## 2023-07-05 DIAGNOSIS — Z8279 Family history of other congenital malformations, deformations and chromosomal abnormalities: Secondary | ICD-10-CM
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
## 2023-07-06 LAB — CBC/D/PLT+RPR+RH+ABO+RUBIGG...
Antibody Screen: NEGATIVE
Basophils Absolute: 0 10*3/uL (ref 0.0–0.2)
Basos: 1 %
EOS (ABSOLUTE): 0.3 10*3/uL (ref 0.0–0.4)
Eos: 5 %
HCV Ab: NONREACTIVE
HIV Screen 4th Generation wRfx: NONREACTIVE
Hematocrit: 29.5 % — ABNORMAL LOW (ref 34.0–46.6)
Hemoglobin: 9.3 g/dL — ABNORMAL LOW (ref 11.1–15.9)
Hepatitis B Surface Ag: NEGATIVE
Immature Grans (Abs): 0 10*3/uL (ref 0.0–0.1)
Immature Granulocytes: 0 %
Lymphocytes Absolute: 1.8 10*3/uL (ref 0.7–3.1)
Lymphs: 33 %
MCH: 24.9 pg — ABNORMAL LOW (ref 26.6–33.0)
MCHC: 31.5 g/dL (ref 31.5–35.7)
MCV: 79 fL (ref 79–97)
Monocytes Absolute: 0.5 10*3/uL (ref 0.1–0.9)
Monocytes: 10 %
Neutrophils Absolute: 2.8 10*3/uL (ref 1.4–7.0)
Neutrophils: 51 %
Platelets: 251 10*3/uL (ref 150–450)
RBC: 3.73 x10E6/uL — ABNORMAL LOW (ref 3.77–5.28)
RDW: 13.2 % (ref 11.7–15.4)
RPR Ser Ql: NONREACTIVE
Rh Factor: POSITIVE
Rubella Antibodies, IGG: 2.37 {index} (ref >0.99)
WBC: 5.4 10*3/uL (ref 3.4–10.8)

## 2023-07-06 LAB — GC/CHLAMYDIA PROBE AMP (~~LOC~~) NOT AT ARMC
Chlamydia: NEGATIVE
Comment: NEGATIVE
Comment: NORMAL
Neisseria Gonorrhea: NEGATIVE

## 2023-07-06 LAB — TSH RFX ON ABNORMAL TO FREE T4: TSH: 1.73 u[IU]/mL (ref 0.450–4.500)

## 2023-07-06 LAB — PROTEIN / CREATININE RATIO, URINE
Creatinine, Urine: 155.9 mg/dL
Protein, Ur: 17.2 mg/dL
Protein/Creat Ratio: 110 mg/g{creat} (ref 0–200)

## 2023-07-06 LAB — URINE CULTURE, OB REFLEX

## 2023-07-06 LAB — HEMOGLOBIN A1C
Est. average glucose Bld gHb Est-mCnc: 117 mg/dL
Hgb A1c MFr Bld: 5.7 % — ABNORMAL HIGH (ref 4.8–5.6)

## 2023-07-06 LAB — CULTURE, OB URINE

## 2023-07-06 LAB — COMPREHENSIVE METABOLIC PANEL WITH GFR
ALT: 5 IU/L (ref 0–32)
AST: 12 IU/L (ref 0–40)
Albumin: 3.4 g/dL — ABNORMAL LOW (ref 4.0–5.0)
Alkaline Phosphatase: 90 IU/L (ref 44–121)
BUN/Creatinine Ratio: 7 — ABNORMAL LOW (ref 9–23)
BUN: 4 mg/dL — ABNORMAL LOW (ref 6–20)
Bilirubin Total: <0.2 mg/dL (ref 0.0–1.2)
CO2: 15 mmol/L — ABNORMAL LOW (ref 20–29)
Calcium: 8.8 mg/dL (ref 8.7–10.2)
Chloride: 103 mmol/L (ref 96–106)
Creatinine, Ser: 0.55 mg/dL — ABNORMAL LOW (ref 0.57–1.00)
Globulin, Total: 2.9 g/dL (ref 1.5–4.5)
Glucose: 76 mg/dL (ref 70–99)
Potassium: 4.1 mmol/L (ref 3.5–5.2)
Sodium: 135 mmol/L (ref 134–144)
Total Protein: 6.3 g/dL (ref 6.0–8.5)
eGFR: 131 mL/min/{1.73_m2} (ref >59)

## 2023-07-06 LAB — HCV INTERPRETATION

## 2023-07-09 ENCOUNTER — Ambulatory Visit: Payer: Self-pay | Admitting: Obstetrics and Gynecology

## 2023-07-09 DIAGNOSIS — O0993 Supervision of high risk pregnancy, unspecified, third trimester: Secondary | ICD-10-CM

## 2023-07-09 DIAGNOSIS — O99810 Abnormal glucose complicating pregnancy: Secondary | ICD-10-CM | POA: Insufficient documentation

## 2023-07-11 LAB — PANORAMA PRENATAL TEST FULL PANEL:PANORAMA TEST PLUS 5 ADDITIONAL MICRODELETIONS: FETAL FRACTION: 20.2

## 2023-07-24 ENCOUNTER — Ambulatory Visit: Admitting: Obstetrics and Gynecology

## 2023-07-24 ENCOUNTER — Encounter: Payer: Self-pay | Admitting: Obstetrics and Gynecology

## 2023-07-24 ENCOUNTER — Telehealth: Payer: Self-pay

## 2023-07-24 ENCOUNTER — Other Ambulatory Visit: Payer: Self-pay

## 2023-07-24 VITALS — BP 111/70 | HR 59 | Wt 167.4 lb

## 2023-07-24 DIAGNOSIS — O99013 Anemia complicating pregnancy, third trimester: Secondary | ICD-10-CM

## 2023-07-24 DIAGNOSIS — O09893 Supervision of other high risk pregnancies, third trimester: Secondary | ICD-10-CM

## 2023-07-24 DIAGNOSIS — Z8759 Personal history of other complications of pregnancy, childbirth and the puerperium: Secondary | ICD-10-CM

## 2023-07-24 DIAGNOSIS — Z98891 History of uterine scar from previous surgery: Secondary | ICD-10-CM

## 2023-07-24 DIAGNOSIS — Z3A30 30 weeks gestation of pregnancy: Secondary | ICD-10-CM

## 2023-07-24 DIAGNOSIS — D649 Anemia, unspecified: Secondary | ICD-10-CM | POA: Diagnosis not present

## 2023-07-24 DIAGNOSIS — O34219 Maternal care for unspecified type scar from previous cesarean delivery: Secondary | ICD-10-CM | POA: Diagnosis not present

## 2023-07-24 DIAGNOSIS — O0933 Supervision of pregnancy with insufficient antenatal care, third trimester: Secondary | ICD-10-CM

## 2023-07-24 DIAGNOSIS — O99810 Abnormal glucose complicating pregnancy: Secondary | ICD-10-CM

## 2023-07-24 DIAGNOSIS — O0993 Supervision of high risk pregnancy, unspecified, third trimester: Secondary | ICD-10-CM

## 2023-07-24 MED ORDER — FERROUS SULFATE 325 (65 FE) MG PO TABS: 325.0000 mg | ORAL_TABLET | Freq: Every day | ORAL | 0 refills | Status: AC

## 2023-07-24 MED ORDER — PRENATAL 27-0.8 MG PO TABS: 1.0000 | ORAL_TABLET | Freq: Every day | ORAL | 3 refills | Status: AC

## 2023-07-24 NOTE — Progress Notes (Signed)
   PRENATAL VISIT NOTE  Subjective:  Tiffany Leblanc is a 23 y.o. [id number] at [redacted]w[redacted]d being seen today for ongoing prenatal care.  She is currently monitored for the following issues for this high-risk pregnancy and has Family history of congenital anomaly of cardiovascular system; Depression; History of gestational hypertension; Late prenatal care affecting pregnancy in third trimester; Supervision of high risk pregnancy in third trimester; History of cesarean delivery; Anemia in pregnancy; Short interval between pregnancies affecting pregnancy in third trimester, antepartum; and Abnormal glucose affecting pregnancy on their problem list.  Patient reports no complaints.  Contractions: Not present. Vag. Bleeding: None.  Movement: Present. Denies leaking of fluid.   The following portions of the patient's history were reviewed and updated as appropriate: allergies, current medications, past family history, past medical history, past social history, past surgical history and problem list.   Objective:    Vitals:   07/24/23 1626  BP: 111/70  Pulse: (!) 59  Weight: 167 lb 6.4 oz (75.9 kg)    Fetal Status:  Fetal Heart Rate (bpm): 143   Movement: Present    General: Alert, oriented and cooperative. Patient is in no acute distress.  Skin: Skin is warm and dry. No rash noted.   Cardiovascular: Normal heart rate noted  Respiratory: Normal respiratory effort, no problems with respiration noted  Abdomen: Soft, gravid, appropriate for gestational age.  Pain/Pressure: Present     Pelvic: Cervical exam deferred        Extremities: Normal range of motion.  Edema: Trace  Mental Status: Normal mood and affect. Normal behavior. Normal judgment and thought content.   Assessment and Plan:  Pregnancy: [id number] at [redacted]w[redacted]d  1. History of gestational hypertension (Primary) BP today normal  2. Late prenatal care affecting pregnancy in third trimester  3. Supervision of high risk pregnancy in third  trimester Has anatomy scan 07/30/23 Needs to return for GTT  4. History of cesarean delivery Needs RCS-- need to schedule after anatomy US   5. Anemia during pregnancy in third trimester IV iron  transfusion ordered  6. Short interval between pregnancies affecting pregnancy in third trimester, antepartum  7. Abnormal glucose affecting pregnancy Needs to do GTT  Preterm labor symptoms and general obstetric precautions including but not limited to vaginal bleeding, contractions, leaking of fluid and fetal movement were reviewed in detail with the patient. Please refer to After Visit Summary for other counseling recommendations.   Return in about 2 weeks (around 08/07/2023) for high OB.  Future Appointments  Date Time Provider Department Center  07/26/2023  8:50 AM [facility]  07/30/2023  2:00 PM [facility] PROVIDER 1 [facility]  07/30/2023  2:30 PM [facility] US4 [facility]  08/17/2023 10:55 AM Raynell Caller, MD [facility]  08/29/2023  2:35 PM Abner Ables, MD [facility]  09/06/2023  3:15 PM Abigail Abler, MD [facility]  09/13/2023  2:35 PM Abigail Abler, MD [facility]  09/20/2023  2:35 PM Raynell Caller, MD [facility]    Jan Mcgill, MD

## 2023-07-24 NOTE — Telephone Encounter (Signed)
 Auth Submission: NO AUTH NEEDED Site of care: Site of care: [facility] Payer: [employer] medicaid Medication & CPT/J Code(s) submitted: Venofer  (Iron  Sucrose) J1756 Route of submission (phone, fax, portal):  Phone # Fax # Auth type: Buy/Bill PB Units/visits requested: 500mg  x 2 doses Reference number:  Approval from: 07/24/23 to 11/23/23

## 2023-07-26 ENCOUNTER — Other Ambulatory Visit: Payer: Self-pay

## 2023-07-26 ENCOUNTER — Other Ambulatory Visit

## 2023-07-26 DIAGNOSIS — O0993 Supervision of high risk pregnancy, unspecified, third trimester: Secondary | ICD-10-CM

## 2023-07-30 ENCOUNTER — Other Ambulatory Visit: Payer: Self-pay | Admitting: Family Medicine

## 2023-07-30 ENCOUNTER — Ambulatory Visit (HOSPITAL_BASED_OUTPATIENT_CLINIC_OR_DEPARTMENT_OTHER): Attending: Obstetrics and Gynecology | Admitting: Obstetrics and Gynecology

## 2023-07-30 ENCOUNTER — Ambulatory Visit (HOSPITAL_BASED_OUTPATIENT_CLINIC_OR_DEPARTMENT_OTHER)

## 2023-07-30 VITALS — BP 117/57 | HR 92

## 2023-07-30 DIAGNOSIS — Z363 Encounter for antenatal screening for malformations: Secondary | ICD-10-CM | POA: Diagnosis not present

## 2023-07-30 DIAGNOSIS — Z3687 Encounter for antenatal screening for uncertain dates: Secondary | ICD-10-CM | POA: Insufficient documentation

## 2023-07-30 DIAGNOSIS — O0933 Supervision of pregnancy with insufficient antenatal care, third trimester: Secondary | ICD-10-CM | POA: Diagnosis not present

## 2023-07-30 DIAGNOSIS — O09293 Supervision of pregnancy with other poor reproductive or obstetric history, third trimester: Secondary | ICD-10-CM | POA: Insufficient documentation

## 2023-07-30 DIAGNOSIS — Z3A34 34 weeks gestation of pregnancy: Secondary | ICD-10-CM | POA: Insufficient documentation

## 2023-07-30 DIAGNOSIS — O99013 Anemia complicating pregnancy, third trimester: Secondary | ICD-10-CM

## 2023-07-30 DIAGNOSIS — O0993 Supervision of high risk pregnancy, unspecified, third trimester: Secondary | ICD-10-CM

## 2023-07-30 DIAGNOSIS — Z3689 Encounter for other specified antenatal screening: Secondary | ICD-10-CM | POA: Diagnosis not present

## 2023-07-30 DIAGNOSIS — O34219 Maternal care for unspecified type scar from previous cesarean delivery: Secondary | ICD-10-CM

## 2023-07-30 DIAGNOSIS — D509 Iron deficiency anemia, unspecified: Secondary | ICD-10-CM | POA: Diagnosis not present

## 2023-07-30 DIAGNOSIS — Z3200 Encounter for pregnancy test, result unknown: Secondary | ICD-10-CM

## 2023-07-30 DIAGNOSIS — Z3A31 31 weeks gestation of pregnancy: Secondary | ICD-10-CM | POA: Diagnosis not present

## 2023-07-30 DIAGNOSIS — O09893 Supervision of other high risk pregnancies, third trimester: Secondary | ICD-10-CM | POA: Insufficient documentation

## 2023-07-30 NOTE — Progress Notes (Signed)
 Maternal-Fetal Medicine Consultation Name: Tiffany Leblanc MRN: [id number]  G5 W2956 at unknown gestational age.  Patient is here for fetal anatomy scan.  She has short interval between pregnancies.  Obstetric history is significant for a term vaginal delivery followed by 2 term cesarean deliveries.  Her most recent delivery was in August 2024 and she had a repeat cesarean delivery.  Patient did not have gestational diabetes in that pregnancy.  Recent hemoglobin A1c was 5.7%. She has not had screening for gestational diabetes in this pregnancy.  She has a history of gestational hypertension and previous pregnancy.  On cell-free fetal DNA screening, the risks of fetal aneuploidies are not increased.  Ultrasound Fetal biometry is consistent with 34w 1d gestation.  Amniotic fluid is normal good fetal activity seen.  Cephalic presentation.  Fetal anatomical survey appears normal but limited by advanced gestational age.  Antenatal testing is reassuring.  BPP 8/8.  We have assigned her EDD at 09/09/2023 based on today's ultrasound measurements.  Suboptimally dated pregnancy I counseled the patient that we have assigned her EDD based on today's ultrasound, which is suboptimal.  Third trimester pregnancy dating carries a margin of error up to 15%. It is reassuring to know that patient did not have gestational diabetes and previous pregnancy.  However, she has prediabetes that increases the chances of gestational diabetes.  I encouraged her to undergo screening for gestational diabetes.  Short Interpregnancy Interval It is defined as the time interval between the end of previous pregnancy and the beginning of next pregnancy.  The impact of short pregnancy interval on the outcome of subsequent pregnancy is uncertain. Some studies have shown that congenital anomalies, preterm delivery, and fetal growth restriction rates are increased in pregnancies with short interpregnancy interval.  However, it is not  supported by other reports. Overall, we should expect good pregnancy outcomes if there are no other high-risk factors.  Patient has iron -deficient anemia. I encouraged the patient to take iron  supplements.  Previous 2 cesarean deliveries Patient is planned to have repeat cesarean delivery.  She was counseled that repeat cesarean deliveries increase the risk of placenta previa and/or placenta accreta spectrum and subsequent pregnancies.  Recommendations - Weekly NSTs that may be performed at your office. - An appointment was made for her to return in 4 weeks for fetal growth and BPP. -Elective cesarean delivery at [redacted] weeks gestation.   Consultation including face-to-face (more than 50%) counseling 30 minutes.

## 2023-07-31 ENCOUNTER — Other Ambulatory Visit

## 2023-07-31 ENCOUNTER — Telehealth: Payer: Self-pay | Admitting: Family Medicine

## 2023-07-31 LAB — ANEMIA PROFILE B
Ferritin: 7 ng/mL — ABNORMAL LOW (ref 15–150)
Folate: 6.4 ng/mL (ref >3.0)
Iron Saturation: 3 % — CL (ref 15–55)
Iron: 21 ug/dL — ABNORMAL LOW (ref 27–159)
Total Iron Binding Capacity: 607 ug/dL (ref 250–450)
UIBC: 586 ug/dL — ABNORMAL HIGH (ref 131–425)
Vitamin B-12: 317 pg/mL (ref 232–1245)

## 2023-07-31 LAB — SPECIMEN STATUS REPORT

## 2023-07-31 NOTE — Telephone Encounter (Signed)
 Called patient to get her labs rescheduled but there was no VM

## 2023-08-03 ENCOUNTER — Ambulatory Visit: Payer: Self-pay | Admitting: Family Medicine

## 2023-08-03 DIAGNOSIS — O0993 Supervision of high risk pregnancy, unspecified, third trimester: Secondary | ICD-10-CM

## 2023-08-07 ENCOUNTER — Encounter: Payer: Self-pay | Admitting: Obstetrics and Gynecology

## 2023-08-16 ENCOUNTER — Ambulatory Visit

## 2023-08-17 ENCOUNTER — Encounter: Admitting: Obstetrics and Gynecology

## 2023-08-17 ENCOUNTER — Encounter: Payer: Self-pay | Admitting: Obstetrics and Gynecology

## 2023-08-20 NOTE — Progress Notes (Signed)
 Patient did not keep her OB appointment for 08/17/2023.  Bebe Izell Raddle MD Attending Center for [employer] Midwife)

## 2023-08-29 ENCOUNTER — Other Ambulatory Visit: Payer: Self-pay

## 2023-08-29 ENCOUNTER — Ambulatory Visit: Admitting: Family Medicine

## 2023-08-29 ENCOUNTER — Other Ambulatory Visit (HOSPITAL_COMMUNITY)
Admission: RE | Admit: 2023-08-29 | Discharge: 2023-08-29 | Disposition: A | Discharge status: Home / Self Care | Source: Ambulatory Visit | Attending: Family Medicine | Admitting: Family Medicine

## 2023-08-29 VITALS — BP 111/74 | HR 102 | Wt 170.0 lb

## 2023-08-29 DIAGNOSIS — Z3A38 38 weeks gestation of pregnancy: Secondary | ICD-10-CM | POA: Diagnosis not present

## 2023-08-29 DIAGNOSIS — O99013 Anemia complicating pregnancy, third trimester: Secondary | ICD-10-CM

## 2023-08-29 DIAGNOSIS — O0993 Supervision of high risk pregnancy, unspecified, third trimester: Secondary | ICD-10-CM | POA: Diagnosis present

## 2023-08-29 DIAGNOSIS — D649 Anemia, unspecified: Secondary | ICD-10-CM

## 2023-08-29 DIAGNOSIS — O09893 Supervision of other high risk pregnancies, third trimester: Secondary | ICD-10-CM | POA: Diagnosis not present

## 2023-08-29 DIAGNOSIS — O34219 Maternal care for unspecified type scar from previous cesarean delivery: Secondary | ICD-10-CM | POA: Diagnosis not present

## 2023-08-29 DIAGNOSIS — Z98891 History of uterine scar from previous surgery: Secondary | ICD-10-CM

## 2023-08-29 DIAGNOSIS — Z8759 Personal history of other complications of pregnancy, childbirth and the puerperium: Secondary | ICD-10-CM

## 2023-08-29 LAB — POCT HEMOGLOBIN-HEMACUE: Hemoglobin: 8.1 g/dL — ABNORMAL LOW (ref 12.0–15.0)

## 2023-08-29 NOTE — Progress Notes (Unsigned)
 PRENATAL VISIT NOTE  Subjective:  Tiffany Leblanc is a 23 y.o. H4E6986 at [redacted]w[redacted]d being seen today for ongoing prenatal care.  She is currently monitored for the following issues for this high-risk pregnancy and has Family history of congenital anomaly of cardiovascular system; Depression; History of gestational hypertension; Late prenatal care affecting pregnancy in third trimester; Supervision of high risk pregnancy in third trimester; History of cesarean delivery; Anemia in pregnancy; Short interval between pregnancies affecting pregnancy in third trimester, antepartum; and Abnormal glucose affecting pregnancy on their problem list.  Patient reports no complaints.  Contractions: Irritability. Vag. Bleeding: None.  Movement: Present. Denies leaking of fluid.   The following portions of the patient's history were reviewed and updated as appropriate: allergies, current medications, past family history, past medical history, past social history, past surgical history and problem list.   Objective:    Vitals:   08/29/23 1519  BP: 111/74  Pulse: (!) 102  Weight: 170 lb (77.1 kg)    Fetal Status:  Fetal Heart Rate (bpm): 140   Movement: Present    General: Alert, oriented and cooperative. Patient is in no acute distress.  Skin: Skin is warm and dry. No rash noted.   Cardiovascular: Normal heart rate noted  Respiratory: Normal respiratory effort, no problems with respiration noted  Abdomen: Soft, gravid, appropriate for gestational age.  Pain/Pressure: Present (with activity)     Pelvic: Cervical exam deferred        Extremities: Normal range of motion.  Edema: Trace  Mental Status: Normal mood and affect. Normal behavior. Normal judgment and thought content.   Assessment and Plan:  Pregnancy: [id number] at [redacted]w[redacted]d 1. Supervision of high risk pregnancy in third trimester (Primary) Up to date Late onset care at 28wk. Limited PNC, never had GTT. Last visit was at 30w and total visits (3  including today) and dated by 34 week US  - Culture, beta strep (group b only) - GC/Chlamydia probe amp ([place])not at [facility]  2. History of gestational hypertension BP wnl  3. Anemia during pregnancy in third trimester Lab Results  Component Value Date   HGB 8.1 (L) 08/29/2023   HGB 9.3 (L) 07/05/2023   HGB CANCELED 07/05/2023   Has not picked up her oral FE, POC HGB today 8.1  Has Fe infusion tomorrow. Reviewed this will not improve her blood counts prior to delivery  4. Short interval between pregnancies affecting pregnancy in third trimester, antepartum  5. History of cesarean delivery Referral to surgery scheduling placed  Discussed IUD placement at time of CS and patient desires this as she does not want another pregnancy but does not want sterilization.  Reviewed options of IUD, patient desires LNG IUD  Patient had not had glucose screening and was not scheduled for this today. Recommend getting A1C and POC BG with preoperative blood work   6. [redacted] weeks gestation of pregnancy - Culture, beta strep (group b only) - GC/Chlamydia probe amp ([place])not at [facility]  Preterm labor symptoms and general obstetric precautions including but not limited to vaginal bleeding, contractions, leaking of fluid and fetal movement were reviewed in detail with the patient. Please refer to After Visit Summary for other counseling recommendations.   Return for CS- in 3-4 days.  Future Appointments  Date Time Provider Department Center  09/06/2023  3:15 PM Zina Jerilynn DELENA, MD [facility]  09/13/2023  2:35 PM Zina Jerilynn DELENA, MD [facility]  09/20/2023  2:35 PM Izell Harari, MD [facility]  Suzen Maryan Masters, MD

## 2023-08-30 ENCOUNTER — Ambulatory Visit

## 2023-08-30 ENCOUNTER — Other Ambulatory Visit: Payer: Self-pay

## 2023-08-30 ENCOUNTER — Encounter (HOSPITAL_COMMUNITY): Payer: Self-pay

## 2023-08-30 ENCOUNTER — Ambulatory Visit: Payer: Self-pay | Admitting: Family Medicine

## 2023-08-30 LAB — GC/CHLAMYDIA PROBE AMP (~~LOC~~) NOT AT ARMC
Chlamydia: NEGATIVE
Comment: NEGATIVE
Comment: NORMAL
Neisseria Gonorrhea: NEGATIVE

## 2023-08-30 NOTE — Patient Instructions (Signed)
 Tiffany Leblanc  08/30/2023   Your procedure is scheduled on:  09/02/2023  Arrive at 1000 at Entrance C on [address] at [facility]  and [employer]. You are invited to use the FREE valet parking or use the Visitor's parking deck.  Pick up the phone at the desk and dial [phone].  Call this number if you have problems the morning of surgery: [phone]  Remember:   Do not eat food:(After Midnight) Desps de medianoche.  You may drink clear liquids until  ___0800__.  Clear liquids means a liquid you can see thru.  It can have color such as Cola or Kool aid.  Tea is OK and coffee as long as no milk or creamer of any kind.  Take these medicines the morning of surgery with A SIP OF WATER :  none   Do not wear jewelry, make-up or nail polish.  Do not wear lotions, powders, or perfumes. Do not wear deodorant.  Do not shave 48 hours prior to surgery.  Do not bring valuables to the hospital.  [facility] is not   responsible for any belongings or valuables brought to the hospital.  Contacts, dentures or bridgework may not be worn into surgery.  Leave suitcase in the car. After surgery it may be brought to your room.  For patients admitted to the hospital, checkout time is 11:00 AM the day of              discharge.      Please read over the following fact sheets that you were given:     Preparing for Surgery

## 2023-08-31 ENCOUNTER — Ambulatory Visit (INDEPENDENT_AMBULATORY_CARE_PROVIDER_SITE_OTHER)

## 2023-08-31 ENCOUNTER — Other Ambulatory Visit: Payer: Self-pay | Admitting: Obstetrics and Gynecology

## 2023-08-31 ENCOUNTER — Encounter (HOSPITAL_COMMUNITY)
Admission: RE | Admit: 2023-08-31 | Discharge: 2023-08-31 | Disposition: A | Discharge status: Home / Self Care | Source: Ambulatory Visit | Attending: Obstetrics and Gynecology | Admitting: Obstetrics and Gynecology

## 2023-08-31 VITALS — BP 137/82 | HR 91 | Temp 97.8°F | Resp 20 | Ht 63.0 in | Wt 173.4 lb

## 2023-08-31 DIAGNOSIS — O0993 Supervision of high risk pregnancy, unspecified, third trimester: Secondary | ICD-10-CM | POA: Insufficient documentation

## 2023-08-31 DIAGNOSIS — O99013 Anemia complicating pregnancy, third trimester: Secondary | ICD-10-CM

## 2023-08-31 DIAGNOSIS — Z01812 Encounter for preprocedural laboratory examination: Secondary | ICD-10-CM | POA: Diagnosis not present

## 2023-08-31 DIAGNOSIS — Z3A38 38 weeks gestation of pregnancy: Secondary | ICD-10-CM | POA: Diagnosis not present

## 2023-08-31 DIAGNOSIS — D649 Anemia, unspecified: Secondary | ICD-10-CM | POA: Diagnosis not present

## 2023-08-31 DIAGNOSIS — Z01818 Encounter for other preprocedural examination: Secondary | ICD-10-CM | POA: Diagnosis present

## 2023-08-31 LAB — CBC
HCT: 29.3 % — ABNORMAL LOW (ref 36.0–46.0)
Hemoglobin: 8.7 g/dL — ABNORMAL LOW (ref 12.0–15.0)
MCH: 22.8 pg — ABNORMAL LOW (ref 26.0–34.0)
MCHC: 29.7 g/dL — ABNORMAL LOW (ref 30.0–36.0)
MCV: 76.7 fL — ABNORMAL LOW (ref 80.0–100.0)
Platelets: 258 K/uL (ref 150–400)
RBC: 3.82 MIL/uL — ABNORMAL LOW (ref 3.87–5.11)
RDW: 15.6 % — ABNORMAL HIGH (ref 11.5–15.5)
WBC: 6.6 K/uL (ref 4.0–10.5)
nRBC: 0 % (ref 0.0–0.2)

## 2023-08-31 MED ORDER — SODIUM CHLORIDE 0.9 % IV SOLN
300.0000 mg | Freq: Once | INTRAVENOUS | Status: AC
  Administered 2023-08-31: 300 mg via INTRAVENOUS
  Filled 2023-08-31: qty 10

## 2023-08-31 NOTE — Progress Notes (Signed)
 Diagnosis: Iron  Deficiency Anemia  Provider:  Praveen Mannam MD  Procedure: IV Infusion  IV Type: Peripheral, IV Location: R Antecubital  Venofer  (Iron  Sucrose), Dose: 300 mg  Infusion Start Time: 1335  Infusion Stop Time: 1515  Post Infusion IV Care: Peripheral IV Discontinued  Discharge: Condition: Good, Destination: Home . AVS Declined  Performed by:  Maximiano JONELLE Pouch, LPN

## 2023-09-01 LAB — CULTURE, BETA STREP (GROUP B ONLY): Strep Gp B Culture: POSITIVE — AB

## 2023-09-01 LAB — RPR: RPR Ser Ql: NONREACTIVE

## 2023-09-02 ENCOUNTER — Encounter (HOSPITAL_COMMUNITY): Payer: Self-pay | Admitting: Obstetrics and Gynecology

## 2023-09-02 ENCOUNTER — Other Ambulatory Visit: Payer: Self-pay

## 2023-09-02 ENCOUNTER — Inpatient Hospital Stay (HOSPITAL_COMMUNITY): Payer: Self-pay | Admitting: Anesthesiology

## 2023-09-02 ENCOUNTER — Inpatient Hospital Stay (HOSPITAL_COMMUNITY)
Admission: RE | Admit: 2023-09-02 | Discharge: 2023-09-05 | DRG: 787 | Disposition: A | Discharge status: Home / Self Care | Attending: Obstetrics and Gynecology | Admitting: Obstetrics and Gynecology

## 2023-09-02 ENCOUNTER — Encounter (HOSPITAL_COMMUNITY)
Admission: RE | Disposition: A | Discharge status: Home / Self Care | Payer: Self-pay | Source: Home / Self Care | Attending: Obstetrics and Gynecology

## 2023-09-02 DIAGNOSIS — O99820 Streptococcus B carrier state complicating pregnancy: Secondary | ICD-10-CM | POA: Diagnosis not present

## 2023-09-02 DIAGNOSIS — O0933 Supervision of pregnancy with insufficient antenatal care, third trimester: Secondary | ICD-10-CM | POA: Diagnosis not present

## 2023-09-02 DIAGNOSIS — O34211 Maternal care for low transverse scar from previous cesarean delivery: Principal | ICD-10-CM | POA: Diagnosis present

## 2023-09-02 DIAGNOSIS — Z3A39 39 weeks gestation of pregnancy: Secondary | ICD-10-CM

## 2023-09-02 DIAGNOSIS — Z30430 Encounter for insertion of intrauterine contraceptive device: Secondary | ICD-10-CM

## 2023-09-02 DIAGNOSIS — D62 Acute posthemorrhagic anemia: Secondary | ICD-10-CM | POA: Diagnosis not present

## 2023-09-02 DIAGNOSIS — O99810 Abnormal glucose complicating pregnancy: Secondary | ICD-10-CM | POA: Diagnosis present

## 2023-09-02 DIAGNOSIS — O99824 Streptococcus B carrier state complicating childbirth: Secondary | ICD-10-CM | POA: Diagnosis present

## 2023-09-02 DIAGNOSIS — O9081 Anemia of the puerperium: Secondary | ICD-10-CM | POA: Diagnosis not present

## 2023-09-02 DIAGNOSIS — D509 Iron deficiency anemia, unspecified: Secondary | ICD-10-CM | POA: Diagnosis present

## 2023-09-02 DIAGNOSIS — Z8249 Family history of ischemic heart disease and other diseases of the circulatory system: Secondary | ICD-10-CM | POA: Diagnosis not present

## 2023-09-02 DIAGNOSIS — Z87891 Personal history of nicotine dependence: Secondary | ICD-10-CM

## 2023-09-02 HISTORY — PX: INTRAUTERINE DEVICE (IUD) INSERTION: SHX5877

## 2023-09-02 LAB — HIV ANTIBODY (ROUTINE TESTING W REFLEX): HIV Screen 4th Generation wRfx: NONREACTIVE

## 2023-09-02 LAB — CREATININE, SERUM
Creatinine, Ser: 0.75 mg/dL (ref 0.44–1.00)
GFR, Estimated: >60 mL/min (ref >60)

## 2023-09-02 LAB — GLUCOSE, CAPILLARY
Glucose-Capillary: 63 mg/dL — ABNORMAL LOW (ref 70–99)
Glucose-Capillary: 66 mg/dL — ABNORMAL LOW (ref 70–99)

## 2023-09-02 LAB — PREPARE RBC (CROSSMATCH)

## 2023-09-02 SURGERY — Surgical Case
Anesthesia: Spinal

## 2023-09-02 MED ORDER — CEFAZOLIN SODIUM-DEXTROSE 2-4 GM/100ML-% IV SOLN: 2.0000 g | INTRAVENOUS | Status: DC

## 2023-09-02 MED ORDER — DIPHENHYDRAMINE HCL 50 MG/ML IJ SOLN: 12.5000 mg | INTRAMUSCULAR | Status: DC | PRN

## 2023-09-02 MED ORDER — WITCH HAZEL-GLYCERIN EX PADS: 1.0000 | MEDICATED_PAD | CUTANEOUS | Status: DC | PRN

## 2023-09-02 MED ORDER — ONDANSETRON HCL 4 MG/2ML IJ SOLN
INTRAMUSCULAR | Status: DC | PRN
  Administered 2023-09-02: 4 mg via INTRAVENOUS

## 2023-09-02 MED ORDER — STERILE WATER FOR IRRIGATION IR SOLN
Status: DC | PRN
  Administered 2023-09-02: 1

## 2023-09-02 MED ORDER — DIPHENHYDRAMINE HCL 50 MG/ML IJ SOLN
12.5000 mg | INTRAMUSCULAR | Status: DC | PRN
  Administered 2023-09-02: 12.5 mg via INTRAVENOUS

## 2023-09-02 MED ORDER — DIPHENHYDRAMINE HCL 50 MG/ML IJ SOLN
INTRAMUSCULAR | Status: AC
  Filled 2023-09-02: qty 1

## 2023-09-02 MED ORDER — DEXAMETHASONE SODIUM PHOSPHATE 10 MG/ML IJ SOLN
INTRAMUSCULAR | Status: DC | PRN
  Administered 2023-09-02: 10 mg via INTRAVENOUS

## 2023-09-02 MED ORDER — POVIDONE-IODINE 10 % EX SWAB
2.0000 | Freq: Once | CUTANEOUS | Status: AC
  Administered 2023-09-02: 2 via TOPICAL

## 2023-09-02 MED ORDER — FENTANYL CITRATE (PF) 100 MCG/2ML IJ SOLN
INTRAMUSCULAR | Status: DC | PRN
  Administered 2023-09-02: 15 ug via INTRATHECAL

## 2023-09-02 MED ORDER — MORPHINE SULFATE (PF) 0.5 MG/ML IJ SOLN
INTRAMUSCULAR | Status: DC | PRN
  Administered 2023-09-02: 150 ug via INTRATHECAL

## 2023-09-02 MED ORDER — SIMETHICONE 80 MG PO CHEW: 80.0000 mg | CHEWABLE_TABLET | ORAL | Status: DC | PRN

## 2023-09-02 MED ORDER — OXYTOCIN-SODIUM CHLORIDE 30-0.9 UT/500ML-% IV SOLN
INTRAVENOUS | Status: DC | PRN
  Administered 2023-09-02: 300 mL via INTRAVENOUS

## 2023-09-02 MED ORDER — ACETAMINOPHEN 325 MG PO TABS
650.0000 mg | ORAL_TABLET | ORAL | Status: DC | PRN
  Administered 2023-09-03 – 2023-09-05 (×6): 650 mg via ORAL
  Filled 2023-09-02 (×7): qty 2

## 2023-09-02 MED ORDER — MORPHINE SULFATE (PF) 0.5 MG/ML IJ SOLN
INTRAMUSCULAR | Status: AC
  Filled 2023-09-02: qty 10

## 2023-09-02 MED ORDER — PRENATAL MULTIVITAMIN CH
1.0000 | ORAL_TABLET | Freq: Every day | ORAL | Status: DC
  Administered 2023-09-03 – 2023-09-05 (×3): 1 via ORAL
  Filled 2023-09-02 (×4): qty 1

## 2023-09-02 MED ORDER — KETOROLAC TROMETHAMINE 30 MG/ML IJ SOLN: 30.0000 mg | Freq: Four times a day (QID) | INTRAMUSCULAR | Status: AC | PRN

## 2023-09-02 MED ORDER — ENOXAPARIN SODIUM 40 MG/0.4ML IJ SOSY
40.0000 mg | PREFILLED_SYRINGE | INTRAMUSCULAR | Status: DC
  Administered 2023-09-03 – 2023-09-05 (×3): 40 mg via SUBCUTANEOUS
  Filled 2023-09-02 (×3): qty 0.4

## 2023-09-02 MED ORDER — MENTHOL 3 MG MT LOZG: 1.0000 | LOZENGE | OROMUCOSAL | Status: DC | PRN

## 2023-09-02 MED ORDER — NALBUPHINE HCL 10 MG/ML IJ SOLN
5.0000 mg | Freq: Once | INTRAMUSCULAR | Status: AC
  Administered 2023-09-03: 5 mg via INTRAVENOUS
  Filled 2023-09-02: qty 1

## 2023-09-02 MED ORDER — SODIUM CHLORIDE 0.9% FLUSH: 3.0000 mL | INTRAVENOUS | Status: DC | PRN

## 2023-09-02 MED ORDER — CEFAZOLIN SODIUM-DEXTROSE 2-3 GM-%(50ML) IV SOLR
INTRAVENOUS | Status: DC | PRN
Start: 2023-09-02 — End: 2023-09-02
  Administered 2023-09-02: 2 g via INTRAVENOUS

## 2023-09-02 MED ORDER — SODIUM CHLORIDE 0.9% IV SOLUTION: Freq: Once | INTRAVENOUS | Status: DC

## 2023-09-02 MED ORDER — SIMETHICONE 80 MG PO CHEW
80.0000 mg | CHEWABLE_TABLET | Freq: Three times a day (TID) | ORAL | Status: DC
  Administered 2023-09-03 – 2023-09-05 (×7): 80 mg via ORAL
  Filled 2023-09-02 (×9): qty 1

## 2023-09-02 MED ORDER — NALBUPHINE HCL 10 MG/ML IJ SOLN
5.0000 mg | Freq: Once | INTRAMUSCULAR | Status: AC
  Administered 2023-09-02: 5 mg via INTRAVENOUS

## 2023-09-02 MED ORDER — MEPERIDINE HCL 25 MG/ML IJ SOLN: 6.2500 mg | INTRAMUSCULAR | Status: DC | PRN

## 2023-09-02 MED ORDER — OXYCODONE HCL 5 MG PO TABS
5.0000 mg | ORAL_TABLET | ORAL | Status: DC | PRN
  Administered 2023-09-03: 5 mg via ORAL
  Filled 2023-09-02: qty 1

## 2023-09-02 MED ORDER — TETANUS-DIPHTH-ACELL PERTUSSIS 5-2.5-18.5 LF-MCG/0.5 IM SUSY: 0.5000 mL | PREFILLED_SYRINGE | Freq: Once | INTRAMUSCULAR | Status: DC

## 2023-09-02 MED ORDER — DIPHENHYDRAMINE HCL 25 MG PO CAPS
25.0000 mg | ORAL_CAPSULE | ORAL | Status: DC | PRN
  Administered 2023-09-02: 25 mg via ORAL
  Filled 2023-09-02: qty 1

## 2023-09-02 MED ORDER — SCOPOLAMINE 1 MG/3DAYS TD PT72
MEDICATED_PATCH | TRANSDERMAL | Status: AC
Start: 2023-09-02 — End: 2023-09-02
  Filled 2023-09-02: qty 1

## 2023-09-02 MED ORDER — DIPHENHYDRAMINE HCL 25 MG PO CAPS: 25.0000 mg | ORAL_CAPSULE | Freq: Four times a day (QID) | ORAL | Status: DC | PRN

## 2023-09-02 MED ORDER — OXYTOCIN-SODIUM CHLORIDE 30-0.9 UT/500ML-% IV SOLN
2.5000 [IU]/h | INTRAVENOUS | Status: AC
  Administered 2023-09-02: 2.5 [IU]/h via INTRAVENOUS
  Filled 2023-09-02: qty 500

## 2023-09-02 MED ORDER — BUPIVACAINE IN DEXTROSE 0.75-8.25 % IT SOLN
INTRATHECAL | Status: DC | PRN
  Administered 2023-09-02: 1.55 mL via INTRATHECAL

## 2023-09-02 MED ORDER — ONDANSETRON HCL 4 MG/2ML IJ SOLN
4.0000 mg | Freq: Three times a day (TID) | INTRAMUSCULAR | Status: DC | PRN
Start: 2023-09-02 — End: 2023-09-05

## 2023-09-02 MED ORDER — LEVONORGESTREL 20 MCG/DAY IU IUD
INTRAUTERINE_SYSTEM | INTRAUTERINE | Status: AC
Start: 2023-09-02 — End: 2023-09-02
  Filled 2023-09-02: qty 1

## 2023-09-02 MED ORDER — SCOPOLAMINE 1 MG/3DAYS TD PT72: 1.0000 | MEDICATED_PATCH | Freq: Once | TRANSDERMAL | Status: DC

## 2023-09-02 MED ORDER — KETOROLAC TROMETHAMINE 30 MG/ML IJ SOLN
30.0000 mg | Freq: Four times a day (QID) | INTRAMUSCULAR | Status: AC | PRN
Start: 2023-09-02 — End: 2023-09-03
  Administered 2023-09-02: 30 mg via INTRAVENOUS

## 2023-09-02 MED ORDER — NALOXONE HCL 4 MG/10ML IJ SOLN: 1.0000 ug/kg/h | INTRAVENOUS | Status: DC | PRN

## 2023-09-02 MED ORDER — KETOROLAC TROMETHAMINE 30 MG/ML IJ SOLN
INTRAMUSCULAR | Status: AC
  Filled 2023-09-02: qty 1

## 2023-09-02 MED ORDER — OXYCODONE HCL 5 MG PO TABS
10.0000 mg | ORAL_TABLET | ORAL | Status: DC | PRN
  Administered 2023-09-04 – 2023-09-05 (×5): 10 mg via ORAL
  Filled 2023-09-02 (×5): qty 2

## 2023-09-02 MED ORDER — SENNOSIDES-DOCUSATE SODIUM 8.6-50 MG PO TABS
2.0000 | ORAL_TABLET | Freq: Every day | ORAL | Status: DC
  Administered 2023-09-03 – 2023-09-05 (×3): 2 via ORAL
  Filled 2023-09-02 (×4): qty 2

## 2023-09-02 MED ORDER — NALBUPHINE HCL 10 MG/ML IJ SOLN
INTRAMUSCULAR | Status: AC
Start: 2023-09-02 — End: 2023-09-02
  Filled 2023-09-02: qty 1

## 2023-09-02 MED ORDER — LEVONORGESTREL 20 MCG/DAY IU IUD
1.0000 | INTRAUTERINE_SYSTEM | Freq: Once | INTRAUTERINE | Status: AC
  Administered 2023-09-02: 1 via INTRAUTERINE

## 2023-09-02 MED ORDER — PHENYLEPHRINE HCL-NACL 20-0.9 MG/250ML-% IV SOLN
INTRAVENOUS | Status: DC | PRN
  Administered 2023-09-02: 60 ug/min via INTRAVENOUS

## 2023-09-02 MED ORDER — CEFAZOLIN SODIUM-DEXTROSE 2-4 GM/100ML-% IV SOLN
INTRAVENOUS | Status: AC
  Filled 2023-09-02: qty 100

## 2023-09-02 MED ORDER — GABAPENTIN 100 MG PO CAPS
300.0000 mg | ORAL_CAPSULE | Freq: Every day | ORAL | Status: DC
  Administered 2023-09-02 – 2023-09-04 (×3): 300 mg via ORAL
  Filled 2023-09-02 (×3): qty 3

## 2023-09-02 MED ORDER — LACTATED RINGERS IV SOLN: INTRAVENOUS | Status: DC

## 2023-09-02 MED ORDER — SODIUM CHLORIDE 0.9 % IR SOLN
Status: DC | PRN
  Administered 2023-09-02: 1

## 2023-09-02 MED ORDER — FENTANYL CITRATE (PF) 100 MCG/2ML IJ SOLN
INTRAMUSCULAR | Status: AC
  Filled 2023-09-02: qty 2

## 2023-09-02 MED ORDER — NALOXONE HCL 0.4 MG/ML IJ SOLN: 0.4000 mg | INTRAMUSCULAR | Status: DC | PRN

## 2023-09-02 MED ORDER — SCOPOLAMINE 1 MG/3DAYS TD PT72
1.0000 | MEDICATED_PATCH | TRANSDERMAL | Status: DC
  Administered 2023-09-02: 1.5 mg via TRANSDERMAL

## 2023-09-02 MED ORDER — PNEUMOCOCCAL 20-VAL CONJ VACC 0.5 ML IM SUSY
0.5000 mL | PREFILLED_SYRINGE | INTRAMUSCULAR | Status: DC
  Filled 2023-09-02: qty 0.5

## 2023-09-02 MED ORDER — TRANEXAMIC ACID-NACL 1000-0.7 MG/100ML-% IV SOLN
INTRAVENOUS | Status: DC | PRN
Start: 2023-09-02 — End: 2023-09-02
  Administered 2023-09-02: 1000 mg via INTRAVENOUS

## 2023-09-02 MED ORDER — ONDANSETRON HCL 4 MG/2ML IJ SOLN
INTRAMUSCULAR | Status: AC
  Filled 2023-09-02: qty 2

## 2023-09-02 MED ORDER — DIBUCAINE (PERIANAL) 1 % EX OINT
1.0000 | TOPICAL_OINTMENT | CUTANEOUS | Status: DC | PRN
Start: 2023-09-02 — End: 2023-09-05

## 2023-09-02 MED ORDER — ZOLPIDEM TARTRATE 5 MG PO TABS: 5.0000 mg | ORAL_TABLET | Freq: Every evening | ORAL | Status: DC | PRN

## 2023-09-02 SURGICAL SUPPLY — 30 items
BENZOIN TINCTURE PRP APPL 2/3 (GAUZE/BANDAGES/DRESSINGS) IMPLANT
CHLORAPREP W/TINT 26 (MISCELLANEOUS) ×2 IMPLANT
CLAMP UMBILICAL CORD (MISCELLANEOUS) ×1 IMPLANT
CLOTH BEACON ORANGE TIMEOUT ST (SAFETY) ×1 IMPLANT
DERMABOND ADVANCED .7 DNX12 (GAUZE/BANDAGES/DRESSINGS) IMPLANT
DRSG OPSITE POSTOP 4X10 (GAUZE/BANDAGES/DRESSINGS) ×1 IMPLANT
ELECTRODE REM PT RTRN 9FT ADLT (ELECTROSURGICAL) ×1 IMPLANT
EXTRACTOR VACUUM BELL STYLE (SUCTIONS) IMPLANT
GLOVE BIOGEL PI IND STRL 6.5 (GLOVE) ×2 IMPLANT
GLOVE ECLIPSE 6.5 STRL STRAW (GLOVE) ×2 IMPLANT
GOWN STRL REUS W/TWL LRG LVL3 (GOWN DISPOSABLE) ×3 IMPLANT
KIT ABG SYR 3ML LUER SLIP (SYRINGE) IMPLANT
MAT PREVALON FULL STRYKER (MISCELLANEOUS) IMPLANT
NDL HYPO 25X1 1.5 SAFETY (NEEDLE) IMPLANT
NEEDLE HYPO 22GX1.5 SAFETY (NEEDLE) ×1 IMPLANT
NEEDLE HYPO 25X1 1.5 SAFETY (NEEDLE) IMPLANT
NS IRRIG 1000ML POUR BTL (IV SOLUTION) ×1 IMPLANT
PACK C SECTION WH (CUSTOM PROCEDURE TRAY) ×1 IMPLANT
PAD OB MATERNITY 4.3X12.25 (PERSONAL CARE ITEMS) ×1 IMPLANT
RTRCTR C-SECT PINK 25CM LRG (MISCELLANEOUS) IMPLANT
STRIP CLOSURE SKIN 1/2X4 (GAUZE/BANDAGES/DRESSINGS) IMPLANT
SUT MON AB 4-0 PS1 27 (SUTURE) ×1 IMPLANT
SUT PDS AB 0 CTX 60 (SUTURE) IMPLANT
SUT PLAIN ABS 2-0 CT1 27XMFL (SUTURE) ×1 IMPLANT
SUT VIC AB 0 CT1 36 (SUTURE) ×2 IMPLANT
SUT VIC AB 0 CTX36XBRD ANBCTRL (SUTURE) ×1 IMPLANT
SYR CONTROL 10ML LL (SYRINGE) ×1 IMPLANT
TOWEL OR 17X24 6PK STRL BLUE (TOWEL DISPOSABLE) ×1 IMPLANT
TRAY FOLEY W/BAG SLVR 14FR LF (SET/KITS/TRAYS/PACK) ×1 IMPLANT
WATER STERILE IRR 1000ML POUR (IV SOLUTION) ×1 IMPLANT

## 2023-09-02 NOTE — Transfer of Care (Signed)
 Immediate Anesthesia Transfer of Care Note  Patient: Tiffany Leblanc  Procedure(s) Performed: CESAREAN DELIVERY INSERTION, INTRAUTERINE DEVICE  Patient Location: PACU  Anesthesia Type:Spinal  Level of Consciousness: awake  Airway & Oxygen Therapy: Patient Spontanous Breathing  Post-op Assessment: Report given to RN  Post vital signs: Reviewed and stable  Last Vitals:  Vitals Value Taken Time  BP    Temp    Pulse 78 09/02/23 14:32  Resp 14 09/02/23 14:32  SpO2 100 % 09/02/23 14:32  Vitals shown include unfiled device data.  Last Pain:  Vitals:   09/02/23 1133  TempSrc: Oral  PainSc:          Complications: No notable events documented.

## 2023-09-02 NOTE — OR Nursing (Signed)
 Hypoglycemic Event  CBG: 66  Treatment: 4 oz juice/soda  Symptoms: None  Follow-up CBG: Time:  CBG Result:63  Per [place], okay to continue without additional CBG if patient remains asymptomatic   Possible Reasons for Event: Other: NPO for procedure  Comments/MD notified: Gene Vincenzo Delon LOISE

## 2023-09-02 NOTE — Anesthesia Preprocedure Evaluation (Signed)
 Anesthesia Evaluation  Patient identified by MRN, date of birth, ID band Patient awake    Reviewed: Allergy & Precautions, H&P , NPO status , Patient's Chart, lab work & pertinent test results  History of Anesthesia Complications Negative for: history of anesthetic complications  Airway Mallampati: II  TM Distance: >3 FB     Dental   Pulmonary neg pulmonary ROS, Patient abstained from smoking., former smoker   Pulmonary exam normal        Cardiovascular negative cardio ROS  Rhythm:regular Rate:Normal     Neuro/Psych negative neurological ROS  negative psych ROS   GI/Hepatic negative GI ROS, Neg liver ROS,,,  Endo/Other  negative endocrine ROS    Renal/GU negative Renal ROS  negative genitourinary   Musculoskeletal   Abdominal   Peds  Hematology  (+) Blood dyscrasia, anemia   Anesthesia Other Findings   Reproductive/Obstetrics (+) Pregnancy                              Anesthesia Physical Anesthesia Plan  ASA: 2  Anesthesia Plan: Spinal   Post-op Pain Management:    Induction:   PONV Risk Score and Plan: Ondansetron  and Scopolamine  patch - Pre-op  Airway Management Planned: Natural Airway  Additional Equipment:   Intra-op Plan:   Post-operative Plan:   Informed Consent: I have reviewed the patients History and Physical, chart, labs and discussed the procedure including the risks, benefits and alternatives for the proposed anesthesia with the patient or authorized representative who has indicated his/her understanding and acceptance.       Plan Discussed with: Anesthesiologist and CRNA  Anesthesia Plan Comments:          Anesthesia Quick Evaluation
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
## 2023-09-02 NOTE — Anesthesia Postprocedure Evaluation (Signed)
 Anesthesia Post Note  Patient: Tiffany Leblanc  Procedure(s) Performed: CESAREAN DELIVERY INSERTION, INTRAUTERINE DEVICE     Patient location during evaluation: PACU Anesthesia Type: Spinal Level of consciousness: oriented and awake and alert Pain management: pain level controlled Vital Signs Assessment: post-procedure vital signs reviewed and stable Respiratory status: spontaneous breathing, respiratory function stable and patient connected to nasal cannula oxygen Cardiovascular status: blood pressure returned to baseline and stable Postop Assessment: no headache, no backache and no apparent nausea or vomiting Anesthetic complications: no   No notable events documented.  Last Vitals:  Vitals:   09/02/23 1554 09/02/23 1707  BP: 123/79 130/89  Pulse: (!) 58 60  Resp: 14 18  Temp: 36.4 C 36.5 C  SpO2: 100% 100%    Last Pain:  Vitals:   09/02/23 1707  TempSrc: Oral  PainSc: 2    Pain Goal:                Epidural/Spinal Function Cutaneous sensation: Tingles (09/02/23 1707), Patient able to flex knees: Yes (09/02/23 1707), Patient able to lift hips off bed: Yes (09/02/23 1707), Back pain beyond tenderness at insertion site: No (09/02/23 1707), Progressively worsening motor and/or sensory loss: No (09/02/23 1707), Bowel and/or bladder incontinence post epidural: No (09/02/23 1707)  Payden Bonus

## 2023-09-02 NOTE — Anesthesia Procedure Notes (Signed)
 Spinal  Patient location during procedure: OR Start time: 09/02/2023 1:12 PM End time: 09/02/2023 1:17 PM Reason for block: surgical anesthesia Staffing Anesthesiologist: Mallory Manus, MD Performed by: Mallory Manus, MD Authorized by: Mallory Manus, MD   Preanesthetic Checklist Completed: patient identified, IV checked, site marked, risks and benefits discussed, surgical consent, monitors and equipment checked, pre-op evaluation and timeout performed Spinal Block Patient position: sitting Prep: DuraPrep Patient monitoring: heart rate, cardiac monitor, continuous pulse ox and blood pressure Approach: midline Location: L4-5 Injection technique: single-shot Needle Needle type: Sprotte  Needle gauge: 24 G Needle length: 9 cm Assessment Sensory level: T4 Events: CSF return

## 2023-09-02 NOTE — Discharge Summary (Signed)
 Postpartum Discharge Summary  Date of Service updated***     Patient Name: Tiffany Leblanc DOB: Nov 02, 2000 MRN: [id number]  Date of admission: 09/02/2023 Delivery date:09/02/2023 Delivering provider: NICHOLAUS BURNARD HERO Date of discharge: 09/02/2023  Admitting diagnosis: Maternal care due to low transverse uterine scar from previous cesarean delivery [O34.211] Encounter for IUD insertion [Z30.430] Cesarean delivery delivered [O82] Intrauterine pregnancy: [redacted]w[redacted]d     Secondary diagnosis:  Active Problems:   Cesarean delivery delivered  Additional problems: ***    Discharge diagnosis: {DX.:23714}                                              Post partum procedures:{Postpartum procedures:23558} Augmentation: N/A Complications: None  Hospital course: Sceduled C/S   23 y.o. yo H4E5985 at [redacted]w[redacted]d was admitted to the hospital 09/02/2023 for scheduled cesarean section with the following indication:Elective Repeat.Delivery details are as follows:  Membrane Rupture Time/Date: 1:42 PM,09/02/2023  Delivery Method:C-Section, Low Transverse Operative Delivery:N/A Details of operation can be found in separate operative note.  Patient had a postpartum course complicated by***.  She is ambulating, tolerating a regular diet, passing flatus, and urinating well. Patient is discharged home in stable condition on  09/02/23        Newborn Data: Birth date:09/02/2023 Birth time:1:42 PM Gender:Female Living status:Living Apgars:8 ,9  Weight:2582 g    Magnesium Sulfate received: {Mag received:30440022} BMZ received: No Rhophylac:N/A MMR:N/A T-DaP:{Tdap:23962} Flu: N/A RSV Vaccine received: No Transfusion:{Transfusion received:30440034}  Immunizations received: Immunization History  Administered Date(s) Administered   Tdap 06/03/2015, 05/03/2018    Physical exam  Vitals:   09/02/23 1126 09/02/23 1133  BP: 121/70   Pulse: 99   Resp: 18   Temp:  97.7 F (36.5 C)  TempSrc:  Oral  SpO2: 99%    Weight: 78.4 kg   Height: 5' 3 (1.6 m)    General: {Exam; general:21111117} Lochia: {Desc; appropriate/inappropriate:30686::appropriate} Uterine Fundus: {Desc; firm/soft:30687} Incision: {Exam; incision:21111123} DVT Evaluation: {Exam; icu:7888877} Labs: Lab Results  Component Value Date   WBC 6.6 08/31/2023   HGB 8.7 (L) 08/31/2023   HCT 29.3 (L) 08/31/2023   MCV 76.7 (L) 08/31/2023   PLT 258 08/31/2023      Latest Ref Rng & Units 07/05/2023   11:35 AM  CMP  Glucose 70 - 99 mg/dL 76   BUN 6 - 20 mg/dL 4   Creatinine 9.42 - 8.99 mg/dL 9.44   Sodium 865 - 855 mmol/L 135   Potassium 3.5 - 5.2 mmol/L 4.1   Chloride 96 - 106 mmol/L 103   CO2 20 - 29 mmol/L 15   Calcium 8.7 - 10.2 mg/dL 8.8   Total Protein 6.0 - 8.5 g/dL 6.3   Total Bilirubin 0.0 - 1.2 mg/dL <9.7   Alkaline Phos 44 - 121 IU/L 90   AST 0 - 40 IU/L 12   ALT 0 - 32 IU/L 5    Edinburgh Score:    09/16/2020    3:56 PM  Edinburgh Postnatal Depression Scale Screening Tool  I have been able to laugh and see the funny side of things. 0  I have looked forward with enjoyment to things. 0  I have blamed myself unnecessarily when things went wrong. 0  I have been anxious or worried for no good reason. 0  I have felt scared or panicky for no good reason. 0  Things have been getting on top of me. 0  I have been so unhappy that I have had difficulty sleeping. 0  I have felt sad or miserable. 0  I have been so unhappy that I have been crying. 0  The thought of harming myself has occurred to me. 0  Edinburgh Postnatal Depression Scale Total 0      Data saved with a previous flowsheet row definition   No data recorded  After visit meds:  Allergies as of 09/02/2023       Reactions   Coconut (cocos Nucifera)    States hives with coconut soap     Med Rec must be completed prior to using this [facility]***        Discharge home in stable condition Infant Feeding: {Baby feeding:23562} Infant  Disposition:{CHL IP OB HOME WITH FNUYZM:76418} Discharge instruction: per After Visit Summary and Postpartum booklet. Activity: Advance as tolerated. Pelvic rest for 6 weeks.  Diet: {OB ipzu:78888878} Future Appointments: Future Appointments  Date Time Provider Department Center  09/06/2023  3:15 PM Zina Jerilynn LABOR, MD [facility]  09/13/2023  2:35 PM Zina Jerilynn LABOR, MD [facility]  09/20/2023  2:35 PM Izell Harari, MD [facility]   Follow up Visit:   Please schedule this patient for a In person postpartum visit in 4 weeks with the following provider: MD. Additional Postpartum F/U:Incision check 1 week  High risk pregnancy complicated by: limited care Delivery mode:  C-Section, Low Transverse Anticipated Birth Control:  PP IUD placed   09/02/2023 Burnard CHRISTELLA Moats, MD

## 2023-09-02 NOTE — Op Note (Addendum)
 Tiffany Leblanc PROCEDURE DATE: 09/02/2023  PREOPERATIVE DIAGNOSES: Intrauterine pregnancy at [redacted]w[redacted]d weeks gestation; previous uterine incision two prior low transverse c-sections with desire for scheduled repeat  POSTOPERATIVE DIAGNOSES: The same  PROCEDURE: Repeat Low Transverse Cesarean Section with Mirena  IUD placement  SURGEON:  Dr. Yolanda Moats  ASSISTANT:  Dr. Mardy Shropshire & Dr. Chiquita Clover An experienced assistant was required given the standard of surgical care given the complexity of the case.  This assistant was needed for exposure, dissection, suctioning, retraction, instrument exchange, and for overall help during the procedure.   ANESTHESIOLOGY TEAM: Anesthesiologist: Mallory Manus, MD CRNA: Jeanenne Rock LABOR, CRNA  INDICATIONS: Tiffany Leblanc is a 23 y.o. H4E5985 at [redacted]w[redacted]d here for cesarean section secondary to the indications listed under preoperative diagnoses; please see preoperative note for further details.  The risks of surgery were discussed with the patient including but were not limited to: bleeding which may require transfusion or reoperation; infection which may require antibiotics; injury to bowel, bladder, ureters or other surrounding organs; injury to the fetus; need for additional procedures including hysterectomy in the event of a life-threatening hemorrhage; formation of adhesions; placental abnormalities wth subsequent pregnancies; incisional problems; thromboembolic phenomenon and other postoperative/anesthesia complications.  The patient concurred with the proposed plan, giving informed written consent for the procedure.    FINDINGS:  Viable female infant in cephalic presentation.  Apgars 8 and 9.  Clear amniotic fluid.  Intact placenta, three vessel cord.  Normal uterus, fallopian tubes and ovaries bilaterally.  ANESTHESIA: Spinal INTRAVENOUS FLUIDS: 1300 ml   ESTIMATED BLOOD LOSS: 197 ml URINE OUTPUT:  400 ml SPECIMENS: Placenta sent to patient's home as  requested COMPLICATIONS: None immediate  PROCEDURE IN DETAIL:  The patient preoperatively received intravenous antibiotics and had sequential compression devices applied to her lower extremities.  She was then taken to the operating room where spinal anesthesia was administered was administed. She was then placed in a dorsal supine position with a leftward tilt, and prepped and draped in a sterile manner.  A foley catheter was placed into her bladder and attached to constant gravity.    After an adequate timeout was performed, a Pfannenstiel skin incision was made with scalpel on her preexisting scar and carried through to the underlying layer of fascia. The fascia was incised in the midline, and this incision was extended bilaterally.  The rectus muscles were separated in the midline and the peritoneum was entered bluntly. The Alexis self-retaining retractor was introduced into the abdominal cavity.  Attention was turned to the lower uterine segment where a low transverse hysterotomy was made with a scalpel and extended bilaterally bluntly.  The infant was successfully delivered, the cord was clamped and cut after one minute, and the infant was handed over to the awaiting neonatology team. Uterine massage was then administered, and the placenta delivered intact with a three-vessel cord. The uterus was then cleared of clots and debris.    The hysterotomy was closed about halfway with 0 Vicryl in a running locked fashion. The Mirena  IUD was removed from the inserter device and placed at the fundus with the strings directed towards the cervix. The rest of the hysterotomy was closed, taking care to not incorporate the strings into the hysterotomy closure. Incision inspected and hemostatic.The pelvis was cleared of all clot and debris. The retractor was removed.  The peritoneum was closed with a 0 Vicryl running stitch. The fascia was then closed using 2 looped 0 PDS in a running fashion.  The subcutaneous layer  was irrigated and everything was hemostatic  and due to depth of tissue, it was re-approximated with 2-0 plain gut in a interrupted fashion. The skin was closed with a 4-0 monocryl  subcuticular stitch. The patient tolerated the procedure well. Sponge, instrument and needle counts were correct x 3.  She was taken to the recovery room in stable condition.   Mardy Shropshire, MD FMOB Fellow, Faculty practice [facility], Center for [facility]   Attestation of Attending Supervision of MAINE Fellow: Evaluation, management, and procedures were performed by the [facility] Fellow under my supervision and collaboration. I was scrubbed and present for all portions of this procedure. I agree with the documentation and plan.  LOIS Yolanda Moats, MD, [facility] Attending Center for [employer] (Faculty Practice)  09/02/2023 2:34 PM

## 2023-09-03 ENCOUNTER — Encounter (HOSPITAL_COMMUNITY): Payer: Self-pay | Admitting: Obstetrics and Gynecology

## 2023-09-03 LAB — CBC
HCT: 27.8 % — ABNORMAL LOW (ref 36.0–46.0)
Hemoglobin: 8.3 g/dL — ABNORMAL LOW (ref 12.0–15.0)
MCH: 22.7 pg — ABNORMAL LOW (ref 26.0–34.0)
MCHC: 29.9 g/dL — ABNORMAL LOW (ref 30.0–36.0)
MCV: 76 fL — ABNORMAL LOW (ref 80.0–100.0)
Platelets: 252 K/uL (ref 150–400)
RBC: 3.66 MIL/uL — ABNORMAL LOW (ref 3.87–5.11)
RDW: 15.6 % — ABNORMAL HIGH (ref 11.5–15.5)
WBC: 11.6 K/uL — ABNORMAL HIGH (ref 4.0–10.5)
nRBC: 0.7 % — ABNORMAL HIGH (ref 0.0–0.2)

## 2023-09-03 LAB — HEMOGLOBIN A1C
Hgb A1c MFr Bld: 5.5 % (ref 4.8–5.6)
Mean Plasma Glucose: 111 mg/dL

## 2023-09-03 MED ORDER — OXYCODONE HCL 5 MG PO TABS
5.0000 mg | ORAL_TABLET | Freq: Once | ORAL | Status: AC
  Administered 2023-09-03: 5 mg via ORAL
  Filled 2023-09-03: qty 1

## 2023-09-03 MED ORDER — IBUPROFEN 600 MG PO TABS
600.0000 mg | ORAL_TABLET | Freq: Four times a day (QID) | ORAL | Status: DC
  Administered 2023-09-03 – 2023-09-05 (×8): 600 mg via ORAL
  Filled 2023-09-03 (×8): qty 1
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
## 2023-09-03 NOTE — Lactation Note (Signed)
 This note was copied from a baby's chart. Lactation Consultation Note  Patient Name: Tiffany Leblanc Date: 09/03/2023 Age:25 hours  Reason for consult: Initial assessment;Term;Infant < 6lbs;Other (Comment) (LPNC)  P4, [redacted]w[redacted]d, 2465 g  Initial LC visit to mother and her baby Tiffany. Mother was resting, holding baby and talking with visitors. She indicates her feeding plan is to breast and formula feed. She desires to pump.  Unclear of mother's breastfeeding experience with her other children. She states her milk volume came in, she pumped some, and reports a lot of leaking of breast milk on her clothing and not having breast pads. She says the cost of pads and amount of laundry was difficult. Mother was given breast pads for home and breast shells ( for collection of milk only during leaking).  Breast pump was set up and mother educated on use. She was fitted with 21 mm flange and advised to pump every 3 hrs in the initiation setting to stimulate and provide breast milk for her baby.  Mother encouraged to latch baby with feeding cues, place baby skin to skin if not latching, and call for assistance with breastfeeding as needed. Informed mother regarding cluster feeding as baby approaches 24 hours of age. Baby should want to increase feeding frequency, often breastfeeding 8-12 times in 24 hours and cluster feedings.     Maternal Data Has patient been taught Hand Expression?: Yes Does the patient have breastfeeding experience prior to this delivery?: Yes How long did the patient breastfeed?: has attempted to latch, had milk production after discharge, formula fed  Feeding Mother's Current Feeding Choice: Breast Milk and Formula Nipple Type: Nfant Standard Flow (white)  LATCH Score  Not observed    Lactation Tools Discussed/Used Tools: Pump;Flanges Flange Size: 21 (may need 24 mm flange if she feels friction or soreness) Breast pump type: Double-Electric Breast  Pump;Manual Pump Education: Setup, frequency, and cleaning;Milk Storage Reason for Pumping: SGA, < 6lbs Pumping frequency: encouraged to pump every 3 hrs in the initiate setting  Interventions Interventions: Breast feeding basics reviewed;Education;CDC milk storage guidelines;CDC Guidelines for Breast Pump Cleaning;LC Services brochure  Discharge Pump: Manual  Consult Status Consult Status: Follow-up Date: 09/04/23 Follow-up type: In-patient    Joshua Line M 09/03/2023, 3:19 PM

## 2023-09-03 NOTE — Progress Notes (Signed)
 POSTPARTUM PROGRESS NOTE  POD #1  Subjective:  Tiffany Leblanc is a 23 y.o. H4E5985 s/p rLTCS & Mirena  at [redacted]w[redacted]d. No acute events overnight. She reports she is doing well. She denies any problems with ambulating, voiding or po intake. Denies nausea or vomiting. She has passed flatus. Pain is well controlled.  Lochia is appropriate.  Objective: Blood pressure 115/65, pulse (!) 58, temperature 98.2 F (36.8 C), temperature source Oral, resp. rate 18, height 5' 3 (1.6 m), weight 78.4 kg, SpO2 100%, unknown if currently breastfeeding.  Physical Exam:  General: alert, cooperative and no distress Chest: no respiratory distress Heart: regular rate, distal pulses intact Uterine Fundus: firm, appropriately tender DVT Evaluation: No calf swelling or tenderness Extremities: trace edema Skin: warm, dry; incision clean/dry/intact w/honeycomb dressing in place  Recent Labs    08/31/23 1300  HGB 8.7*  HCT 29.3*    Assessment/Plan: Tiffany Leblanc is a 23 y.o. H4E5985 s/p rLTCS at [redacted]w[redacted]d for hx of two prior CS.  POD#1 - Doing welll; pain well controlled. H/H appropriate  Routine postpartum care  OOB, ambulated  Lovenox  for VTE prophylaxis Chronic Anemia: asymptomatic, expected, clinically significant  Continue po ferrous sulfate  every other day  Contraception: Mirena  Feeding: Formula  Dispo: Plan for discharge tomorrow.   LOS: 1 day   Tiffany Heitmeyer, MD OB Fellow  09/03/2023, 6:00 AM

## 2023-09-04 LAB — TYPE AND SCREEN
ABO/RH(D): B POS
Antibody Screen: NEGATIVE
Unit division: 0
Unit division: 0
Unit division: 0
Unit division: 0

## 2023-09-04 LAB — BPAM RBC
Blood Product Expiration Date: 202508052359
Blood Product Expiration Date: 202508162359
Blood Product Expiration Date: 202508162359
Blood Product Expiration Date: 202508162359
Unit Type and Rh: 7300
Unit Type and Rh: 7300
Unit Type and Rh: 7300
Unit Type and Rh: 7300

## 2023-09-04 NOTE — Progress Notes (Signed)
 CSW was updated by CPS intake reporter Johnette Blush that the report was screened in. Per Johnette the report has a 24 hour response time.  CSW will continue to update support staff if any further information is provided.  CSW identifies no further need for intervention and no barriers to discharge at this time.  Rosina Molt, [place] Clinical Social Worker [phone]

## 2023-09-04 NOTE — Progress Notes (Signed)
 CSW has completed a CPS report to [place] due to the inability to secure a carseat and co-sleeping per support staff.   CSW identifies no further need for intervention and no barriers to discharge at this time.  Tiffany Leblanc, [place] Clinical Social Worker [phone]

## 2023-09-04 NOTE — Progress Notes (Signed)
 POSTPARTUM PROGRESS NOTE  Post op Day 2 Subjective:  Tiffany Leblanc is a 23 y.o. H4E5985 [redacted]w[redacted]d s/p rltcs.  No acute events overnight.  Pt denies problems with ambulating, voiding or po intake.  She denies nausea or vomiting.  Pain is moderately controlled.  She has had flatus. She has not had bowel movement.  Lochia Small.   Objective: Blood pressure 118/76, pulse 79, temperature 98 F (36.7 C), temperature source Oral, resp. rate 16, height 5' 3 (1.6 m), weight 78.4 kg, SpO2 99%, unknown if currently breastfeeding.  Physical Exam:  General: alert, cooperative and no distress Lochia:normal flow Chest: CTAB Heart: RRR no m/r/g Abdomen: +BS, soft, nontender,  Uterine Fundus: firm, dressing c/d/i DVT Evaluation: No calf swelling or tenderness Extremities: trace LE edema  Recent Labs    09/03/23 0502  HGB 8.3*  HCT 27.8*    Assessment/Plan:  ASSESSMENT: Tiffany Leblanc is a 23 y.o. H4E5985 [redacted]w[redacted]d s/p rltcs, doing well. Appropriate blood loss asymptomatic on oral iron . Bottle feeding. Post-placental iud placed. Plan for d/c tomorrow as baby is in nicu    LOS: 2 days   Tiffany Leblanc 09/04/2023, 1:21 PM  3

## 2023-09-05 MED ORDER — IBUPROFEN 600 MG PO TABS: 600.0000 mg | ORAL_TABLET | Freq: Four times a day (QID) | ORAL | 1 refills | Status: AC

## 2023-09-05 MED ORDER — ACETAMINOPHEN 500 MG PO TABS: 1000.0000 mg | ORAL_TABLET | Freq: Four times a day (QID) | ORAL | Status: AC | PRN

## 2023-09-05 MED ORDER — ACETAMINOPHEN 500 MG PO TABS: 1000.0000 mg | ORAL_TABLET | Freq: Four times a day (QID) | ORAL | Status: DC | PRN

## 2023-09-05 MED ORDER — GABAPENTIN 100 MG PO CAPS
300.0000 mg | ORAL_CAPSULE | Freq: Three times a day (TID) | ORAL | Status: DC
  Administered 2023-09-05: 300 mg via ORAL
  Filled 2023-09-05: qty 3

## 2023-09-05 MED ORDER — OXYCODONE HCL 5 MG PO TABS: 5.0000 mg | ORAL_TABLET | ORAL | 0 refills | Status: AC | PRN

## 2023-09-05 MED ORDER — GABAPENTIN 300 MG PO CAPS: 300.0000 mg | ORAL_CAPSULE | Freq: Three times a day (TID) | ORAL | 0 refills | Status: AC

## 2023-09-05 MED ORDER — POLYETHYLENE GLYCOL 3350 17 GM/SCOOP PO POWD: 17.0000 g | Freq: Every day | ORAL | 1 refills | Status: AC | PRN

## 2023-09-05 NOTE — Progress Notes (Signed)
 During report/handoff to day RN, mom and baby were both asleep, with baby in the bed with mom.

## 2023-09-05 NOTE — Patient Instructions (Signed)
 If interested in an outpatient lactation consult in office or virtually please reach out to us  at [facility] (First Floor) [address], [place] Please call [phone] and press 4 for lactation.   -- Lactation support groups:  [facility], Tuesdays 10:00 am -12:00 pm at [address] on the second floor in the conference room, lactating parents and lap babies welcome.  [email or web address]  [email or web address]     Vermell CINDERELLA Pelt, [facility] Center for [facility]

## 2023-09-05 NOTE — Progress Notes (Signed)
 At 2300, I fed baby so I could see for myself how baby is feeding.  I I demonstrated left side lying position while feeding.  When I entered the room baby was in bed with mom, sucking loudly on pacifier.  Mom stated she was in pain, I offered to feed baby for her while doing assessment.  Baby ate readily.   Fed baby via dr brown's ultra preemie nipple.  Mom earlier wasusing the Ultra preemie nipple directly on the bottle of formula instead of  using the supplied dr brown's bottle.  Encouraged mom to use the dr jonna bottle and nipple together.  Mom stated that the nipple leaked on baby.  Baby fell asleep after burping.

## 2023-09-05 NOTE — Progress Notes (Signed)
 CSW has been updated by CPS social worker Ileana Alert. Per the social worker MOB has a carseat at her aunt house and the social worker will obtain the carseat today.  CSW will continue to update support staff once more information is provided.  CSW identifies no further need for intervention and no barriers to discharge at this time.  Rosina Molt, [place] Clinical Social Worker [phone]

## 2023-09-06 ENCOUNTER — Encounter: Payer: Self-pay | Admitting: Obstetrics and Gynecology

## 2023-09-13 ENCOUNTER — Encounter: Payer: Self-pay | Admitting: Obstetrics and Gynecology

## 2023-09-13 ENCOUNTER — Ambulatory Visit

## 2023-09-15 ENCOUNTER — Telehealth (HOSPITAL_COMMUNITY): Payer: Self-pay

## 2023-09-15 NOTE — Telephone Encounter (Signed)
 09/15/2023 1247  Name: TAYLER HEIDEN MRN: [id number] DOB: 2000-03-11  Reason for Call:  Transition of Care Hospital Discharge Call  Contact Status: Patient Contact Status: Message  Language assistant needed:          Follow-Up Questions:    Van Postnatal Depression Scale:  In the Past 7 Days:    PHQ2-9 Depression Scale:     Discharge Follow-up:    Post-discharge interventions: NA  Signature  Rosaline Deretha PEAK

## 2023-09-17 ENCOUNTER — Ambulatory Visit

## 2023-09-20 ENCOUNTER — Encounter: Payer: Self-pay | Admitting: Obstetrics and Gynecology

## 2023-10-16 ENCOUNTER — Ambulatory Visit: Admitting: Family Medicine
